# Patient Record
Sex: Female | Born: 1990 | Race: Black or African American | Hispanic: No | Marital: Single | Smoking: Current some day smoker
Health system: Southern US, Community
[De-identification: ages and names within clinical notes are randomized; demographics above are authoritative.]

## PROBLEM LIST (undated history)

## (undated) DIAGNOSIS — O009 Unspecified ectopic pregnancy without intrauterine pregnancy: Secondary | ICD-10-CM

## (undated) DIAGNOSIS — F209 Schizophrenia, unspecified: Secondary | ICD-10-CM

## (undated) DIAGNOSIS — F319 Bipolar disorder, unspecified: Secondary | ICD-10-CM

## (undated) HISTORY — PX: ECTOPIC PREGNANCY SURGERY: SHX613

## (undated) HISTORY — PX: OTHER SURGICAL HISTORY: SHX169

---
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
## 2008-05-17 ENCOUNTER — Emergency Department (HOSPITAL_COMMUNITY): Admission: EM | Admit: 2008-05-17 | Discharge: 2008-05-17 | Payer: Self-pay | Admitting: Emergency Medicine

## 2008-07-06 ENCOUNTER — Ambulatory Visit (HOSPITAL_COMMUNITY): Admission: EM | Admit: 2008-07-06 | Discharge: 2008-07-06 | Payer: Self-pay | Admitting: Emergency Medicine

## 2008-07-06 ENCOUNTER — Encounter: Payer: Self-pay | Admitting: Obstetrics & Gynecology

## 2008-07-14 ENCOUNTER — Emergency Department (HOSPITAL_COMMUNITY): Admission: EM | Admit: 2008-07-14 | Discharge: 2008-07-14 | Payer: Self-pay | Admitting: Emergency Medicine

## 2009-01-05 ENCOUNTER — Emergency Department (HOSPITAL_COMMUNITY): Admission: EM | Admit: 2009-01-05 | Discharge: 2009-01-05 | Payer: Self-pay | Admitting: Emergency Medicine

## 2009-04-22 ENCOUNTER — Emergency Department (HOSPITAL_COMMUNITY): Admission: EM | Admit: 2009-04-22 | Discharge: 2009-04-23 | Payer: Self-pay | Admitting: Emergency Medicine

## 2009-05-18 ENCOUNTER — Emergency Department (HOSPITAL_COMMUNITY): Admission: EM | Admit: 2009-05-18 | Discharge: 2009-05-18 | Payer: Self-pay | Admitting: Emergency Medicine

## 2009-09-12 ENCOUNTER — Emergency Department (HOSPITAL_COMMUNITY): Admission: EM | Admit: 2009-09-12 | Discharge: 2009-09-12 | Payer: Self-pay | Admitting: Emergency Medicine

## 2010-12-08 LAB — CBC
Platelets: 313 10*3/uL (ref 150–400)
RDW: 13.4 % (ref 11.5–15.5)
WBC: 8.7 10*3/uL (ref 4.0–10.5)

## 2010-12-08 LAB — BASIC METABOLIC PANEL
BUN: 11 mg/dL (ref 6–23)
Calcium: 9.3 mg/dL (ref 8.4–10.5)
GFR calc non Af Amer: >60 mL/min (ref >60)
Glucose, Bld: 137 mg/dL — ABNORMAL HIGH (ref 70–99)
Potassium: 3 mEq/L — ABNORMAL LOW (ref 3.5–5.1)
Sodium: 132 mEq/L — ABNORMAL LOW (ref 135–145)

## 2010-12-08 LAB — APTT: aPTT: 29 seconds (ref 24–37)

## 2010-12-08 LAB — TYPE AND SCREEN: ABO/RH(D): O POS

## 2010-12-08 LAB — ETHANOL: Alcohol, Ethyl (B): 10 mg/dL (ref 0–10)

## 2010-12-08 LAB — PROTIME-INR: INR: 0.9 (ref 0.00–1.49)

## 2010-12-11 LAB — URINALYSIS, ROUTINE W REFLEX MICROSCOPIC
Bilirubin Urine: NEGATIVE
Glucose, UA: NEGATIVE mg/dL
Hgb urine dipstick: NEGATIVE
Ketones, ur: NEGATIVE mg/dL
Specific Gravity, Urine: 1.01 (ref 1.005–1.030)
pH: 6 (ref 5.0–8.0)

## 2010-12-11 LAB — URINE MICROSCOPIC-ADD ON

## 2011-01-15 NOTE — Op Note (Signed)
NAMERIONNA, FELTES              ACCOUNT NO.:  [id number]   MEDICAL RECORD NO.:  [id number]          PATIENT TYPE:  AMB   LOCATION:  ED                            FACILITY:  [facility]   PHYSICIAN:  Lazaro Arms, M.D.   DATE OF BIRTH:  02-Apr-1991   DATE OF PROCEDURE:  07/06/2008  DATE OF DISCHARGE:  07/06/2008                               OPERATIVE REPORT   PREOPERATIVE DIAGNOSES:  1. Ruptured right ectopic pregnancy.  2. Hemoperitoneum.   POSTOPERATIVE DIAGNOSES:  1. Ruptured right ectopic pregnancy.  2. Hemoperitoneum.  3. A 300 mL of pneumoperitoneum.   SURGEON:  Lazaro Arms, MD   ANESTHESIA:  General endotracheal.   FINDINGS:  The patient came to the ER with acute lower quadrant pain,  right greater than left.  She had a sonogram, which revealed free fluid  in the cul-de-sac that could positively identify the ectopic.  She had  an hCG of 3500, empty uterus and so by Vidalia Serpas she had to have a  ruptured ectopic.  I did a total laparoscopy.  She had a ruptured  ectopic, it was quite proximal.  I would not call it cornual, possibly  interstitial.  In any event, I will follow her HCGs down to 0, in case  it was interstitial.  The left tube appeared to be normal, and the  distal right tube appeared to be normal.   DESCRIPTION OF OPERATION:  The patient was taken to the operating room  and placed in supine position, where she underwent general endotracheal  anesthesia, placed in lithotomy position, and prepped and draped in  usual sterile fashion.  Foley catheter was placed.  Hulka tenaculum was  placed for uterine manipulation.  Incisions were made in the umbilicus  two fingerbreadths above the pubis in midline in the right lower  quadrant.  Peritoneal cavity was insufflated.  A nonbladed video  laparoscope trocar was used and placed in the peritoneal cavity with one  pass without difficulty.  Peritoneal cavity was insufflated.  Pelvis had  a large amount of blood at the  time of surgery most of which is clotted.  There was no active bleeding.  It was removed with the suction  irrigator.  The ectopic was in the proximal right fallopian tube.  Harmonic scalpel was used and excision was performed.  Fortunately,  there was no bleeding.  I did strip of the serosa of the uterus.  I do  not think it is endometrium, but it in deed could have been  interstitial.  Pelvis was irrigated vigorously.  The ectopic was removed  in EndoCatch bag.  Pedicle was hemostatic.  Instruments were removed.  Gas was allowed to escape.  Umbilical fascia  was closed.  Skin was closed using skin staples.  The patient tolerated  the procedure well.  She was taken to recovery room in good and stable  condition.  All counts were correct.  She received Ancef  prophylactically.      Lazaro Arms, M.D.  Electronically Signed     LHE/MEDQ  D:  07/06/2008  T:  07/07/2008  Job:  [id number]

## 2011-06-04 LAB — COMPREHENSIVE METABOLIC PANEL
Albumin: 3.9
BUN: 10
Calcium: 9.2
Chloride: 104
Creatinine, Ser: 0.75
GFR calc non Af Amer: >60
Total Bilirubin: 0.3

## 2011-06-04 LAB — CROSSMATCH: Antibody Screen: NEGATIVE

## 2011-06-04 LAB — CBC
HCT: 31 — ABNORMAL LOW
MCHC: 33.2
MCHC: 33.4
MCV: 90.8
MCV: 90.9
Platelets: 291
Platelets: 352
WBC: 13.3 — ABNORMAL HIGH

## 2011-06-04 LAB — URINALYSIS, ROUTINE W REFLEX MICROSCOPIC
Bilirubin Urine: NEGATIVE
Bilirubin Urine: NEGATIVE
Leukocytes, UA: NEGATIVE
Nitrite: NEGATIVE
Nitrite: POSITIVE — AB
Specific Gravity, Urine: 1.025
Specific Gravity, Urine: 1.025
Urobilinogen, UA: 0.2
pH: 7.5

## 2011-06-04 LAB — URINE CULTURE

## 2011-06-04 LAB — DIFFERENTIAL
Basophils Absolute: 0
Basophils Absolute: 0
Lymphocytes Relative: 27
Lymphocytes Relative: 7 — ABNORMAL LOW
Lymphs Abs: 1.9
Monocytes Absolute: 0.6
Monocytes Relative: 8
Neutro Abs: 12.2 — ABNORMAL HIGH
Neutro Abs: 4.6

## 2011-06-04 LAB — URINE MICROSCOPIC-ADD ON

## 2011-06-04 LAB — POCT I-STAT, CHEM 8
BUN: 9
Calcium, Ion: 1.25
Chloride: 107

## 2011-06-04 LAB — PREGNANCY, URINE: Preg Test, Ur: POSITIVE

## 2011-06-04 LAB — LIPASE, BLOOD: Lipase: 14

## 2011-06-19 LAB — COMPREHENSIVE METABOLIC PANEL
ALT: 10
Albumin: 3.9
Alkaline Phosphatase: 78
Calcium: 9.1
Potassium: 3.7
Sodium: 135
Total Protein: 7.6

## 2011-06-19 LAB — URINALYSIS, ROUTINE W REFLEX MICROSCOPIC
Bilirubin Urine: NEGATIVE
Leukocytes, UA: NEGATIVE
Nitrite: POSITIVE — AB
Specific Gravity, Urine: >1.03 — ABNORMAL HIGH
pH: 6

## 2011-06-19 LAB — DIFFERENTIAL
Basophils Relative: 0
Eosinophils Absolute: 0
Lymphs Abs: 2.8
Monocytes Absolute: 0.1 — ABNORMAL LOW
Monocytes Relative: 1 — ABNORMAL LOW
Neutro Abs: 7.1 — ABNORMAL HIGH

## 2011-06-19 LAB — URINE MICROSCOPIC-ADD ON

## 2011-06-19 LAB — CBC
Platelets: 324
RDW: 12.9

## 2011-06-19 LAB — URINE CULTURE: Colony Count: >=100000

## 2011-06-19 LAB — PREGNANCY, URINE: Preg Test, Ur: NEGATIVE

## 2011-06-22 ENCOUNTER — Emergency Department (HOSPITAL_COMMUNITY)
Admission: EM | Admit: 2011-06-22 | Discharge: 2011-06-22 | Disposition: A | Discharge status: Home / Self Care | Payer: Self-pay | Attending: Emergency Medicine | Admitting: Emergency Medicine

## 2011-06-22 DIAGNOSIS — B9689 Other specified bacterial agents as the cause of diseases classified elsewhere: Secondary | ICD-10-CM | POA: Insufficient documentation

## 2011-06-22 DIAGNOSIS — F17200 Nicotine dependence, unspecified, uncomplicated: Secondary | ICD-10-CM | POA: Insufficient documentation

## 2011-06-22 DIAGNOSIS — N72 Inflammatory disease of cervix uteri: Secondary | ICD-10-CM | POA: Insufficient documentation

## 2011-06-22 DIAGNOSIS — A499 Bacterial infection, unspecified: Secondary | ICD-10-CM | POA: Insufficient documentation

## 2011-06-22 DIAGNOSIS — N760 Acute vaginitis: Secondary | ICD-10-CM | POA: Insufficient documentation

## 2011-06-22 LAB — WET PREP, GENITAL

## 2011-06-22 MED ORDER — METRONIDAZOLE 500 MG PO TABS: 500.0000 mg | ORAL_TABLET | Freq: Two times a day (BID) | ORAL | Status: AC

## 2011-06-22 MED ORDER — DOXYCYCLINE HYCLATE 100 MG PO CAPS: 100.0000 mg | ORAL_CAPSULE | Freq: Two times a day (BID) | ORAL | Status: AC

## 2011-06-22 MED ORDER — CEFTRIAXONE SODIUM 250 MG IJ SOLR
INTRAMUSCULAR | Status: AC
  Filled 2011-06-22: qty 250

## 2011-06-22 MED ORDER — CEFTRIAXONE SODIUM 250 MG IJ SOLR
125.0000 mg | Freq: Once | INTRAMUSCULAR | Status: AC
  Administered 2011-06-22: 125 mg via INTRAMUSCULAR

## 2011-06-22 NOTE — ED Notes (Signed)
Was told by sexual partner today that he is being treated for std, and she now wants to be checked.  Denies any vaginal discharge, no fever, denies any urinary s/s

## 2011-06-22 NOTE — ED Notes (Signed)
Pt c/o contracting STD from boyfriend, States boyfriend was at ED last night and told her today he was diagnosed with chlamydia and gonorrhea.  Pt was nervous/ anxious and needs a diagnosis and to be treated.  Pt states has no $ until Thursday. Pt denies vaginal discharge or symptoms.

## 2011-06-22 NOTE — ED Notes (Signed)
Pt stated she has been on depo-provera, is due for shot, has not gotten d/t medicaid not being up to date.

## 2011-06-22 NOTE — ED Notes (Signed)
Pt left the er stating no needs 

## 2011-06-23 NOTE — ED Provider Notes (Signed)
History     CSN: [id number] Arrival date & time: 06/22/2011  8:03 PM   First MD Initiated Contact with Patient 06/22/11 2018      Chief Complaint  Patient presents with  . Exposure to STD    (Consider location/radiation/quality/duration/timing/severity/associated sxs/prior treatment) HPI... patient's boyfriend called and said he had chlamydia. Patient has slight vaginal discharge. No fever chills or dysuria. No abdominal or flank pain. Symptoms started a few days ago. He makes better or worse  History reviewed. No pertinent past medical history.  Past Surgical History  Procedure Date  . Ovarian pregnancy     No family history on file.  History  Substance Use Topics  . Smoking status: Current Everyday Smoker    Types: Cigarettes  . Smokeless tobacco: Not on file  . Alcohol Use: Yes     occ    OB History    Grav Para Term Preterm Abortions TAB SAB Ect Mult Living                  Review of Systems  All other systems reviewed and are negative.    Allergies  Review of patient's allergies indicates no known allergies.  Home Medications   Current Outpatient Rx  Name Route Sig Dispense Refill  . DOXYCYCLINE HYCLATE 100 MG PO CAPS Oral Take 1 capsule (100 mg total) by mouth 2 (two) times daily. 14 capsule 0  . METRONIDAZOLE 500 MG PO TABS Oral Take 1 tablet (500 mg total) by mouth 2 (two) times daily. 14 tablet 0    BP 144/82  Pulse 98  Temp(Src) 97.5 F (36.4 C) (Oral)  Resp 20  Ht 5\' 7"  (1.702 m)  Wt 135 lb (61.236 kg)  BMI 21.14 kg/m2  SpO2 100%  LMP 06/02/2011  Physical Exam  Nursing note and vitals reviewed. Constitutional: She is oriented to person, place, and time. She appears well-developed and well-nourished.  HENT:  Head: Normocephalic and atraumatic.  Eyes: Conjunctivae and EOM are normal. Pupils are equal, round, and reactive to light.  Neck: Normal range of motion. Neck supple.  Cardiovascular: Normal rate and regular rhythm.     Pulmonary/Chest: Effort normal and breath sounds normal.  Abdominal: Soft. Bowel sounds are normal.  Genitourinary:       External genitalia normal. Copious white thick peri cervical discharge.  Cervical motion tenderness. Adnexa normal. uterus normal  Musculoskeletal: Normal range of motion.  Neurological: She is alert and oriented to person, place, and time.  Skin: Skin is warm and dry.  Psychiatric: She has a normal mood and affect.    ED Course  Procedures (including critical care time)  Labs Reviewed  WET PREP, GENITAL - Abnormal; Notable for the following:    Clue Cells, Wet Prep MANY (*)    WBC, Wet Prep HPF POC FEW (*)    All other components within normal limits  GONOCOCCUS CULTURE  CHLAMYDIA TRACHOMATIS, DNA, AMP PROBE   No results found.   1. Cervicitis   2. Bacterial vaginosis       MDM  Patient exposed to at least chlamydia. Will treat for Chlamydia, gonorrhea, and bacterial vaginosis. Followup at health department        Donnetta Hutching, MD 06/23/11 [phone]

## 2011-06-24 LAB — CHLAMYDIA TRACHOMATIS, DNA, AMP PROBE: Chlamydia, DNA Probe: NEGATIVE

## 2011-07-19 ENCOUNTER — Emergency Department (HOSPITAL_COMMUNITY)
Admission: EM | Admit: 2011-07-19 | Discharge: 2011-07-19 | Disposition: A | Discharge status: Home / Self Care | Payer: Self-pay | Attending: Emergency Medicine | Admitting: Emergency Medicine

## 2011-07-19 ENCOUNTER — Encounter (HOSPITAL_COMMUNITY): Payer: Self-pay

## 2011-07-19 DIAGNOSIS — R112 Nausea with vomiting, unspecified: Secondary | ICD-10-CM | POA: Insufficient documentation

## 2011-07-19 DIAGNOSIS — R6883 Chills (without fever): Secondary | ICD-10-CM | POA: Insufficient documentation

## 2011-07-19 DIAGNOSIS — R05 Cough: Secondary | ICD-10-CM | POA: Insufficient documentation

## 2011-07-19 DIAGNOSIS — N390 Urinary tract infection, site not specified: Secondary | ICD-10-CM | POA: Insufficient documentation

## 2011-07-19 DIAGNOSIS — R1110 Vomiting, unspecified: Secondary | ICD-10-CM

## 2011-07-19 DIAGNOSIS — M549 Dorsalgia, unspecified: Secondary | ICD-10-CM | POA: Insufficient documentation

## 2011-07-19 DIAGNOSIS — R059 Cough, unspecified: Secondary | ICD-10-CM | POA: Insufficient documentation

## 2011-07-19 DIAGNOSIS — F17200 Nicotine dependence, unspecified, uncomplicated: Secondary | ICD-10-CM | POA: Insufficient documentation

## 2011-07-19 DIAGNOSIS — IMO0001 Reserved for inherently not codable concepts without codable children: Secondary | ICD-10-CM | POA: Insufficient documentation

## 2011-07-19 LAB — URINALYSIS, ROUTINE W REFLEX MICROSCOPIC
Nitrite: NEGATIVE
Specific Gravity, Urine: 1.02 (ref 1.005–1.030)
Urobilinogen, UA: >8 mg/dL — ABNORMAL HIGH (ref 0.0–1.0)
pH: 6 (ref 5.0–8.0)

## 2011-07-19 LAB — DIFFERENTIAL
Eosinophils Absolute: 0 10*3/uL (ref 0.0–0.7)
Eosinophils Relative: 0 % (ref 0–5)
Lymphocytes Relative: 9 % — ABNORMAL LOW (ref 12–46)
Lymphs Abs: 0.9 10*3/uL (ref 0.7–4.0)
Monocytes Absolute: 1.3 10*3/uL — ABNORMAL HIGH (ref 0.1–1.0)
Monocytes Relative: 13 % — ABNORMAL HIGH (ref 3–12)

## 2011-07-19 LAB — COMPREHENSIVE METABOLIC PANEL
BUN: 6 mg/dL (ref 6–23)
CO2: 25 mEq/L (ref 19–32)
Calcium: 9.6 mg/dL (ref 8.4–10.5)
Creatinine, Ser: 0.75 mg/dL (ref 0.50–1.10)
GFR calc Af Amer: >90 mL/min (ref >90)
GFR calc non Af Amer: >90 mL/min (ref >90)
Glucose, Bld: 108 mg/dL — ABNORMAL HIGH (ref 70–99)
Sodium: 135 mEq/L (ref 135–145)
Total Protein: 8.3 g/dL (ref 6.0–8.3)

## 2011-07-19 LAB — CBC
HCT: 36.8 % (ref 36.0–46.0)
MCH: 30.6 pg (ref 26.0–34.0)
MCV: 92.9 fL (ref 78.0–100.0)
Platelets: 198 10*3/uL (ref 150–400)
RBC: 3.96 MIL/uL (ref 3.87–5.11)
WBC: 10.2 10*3/uL (ref 4.0–10.5)

## 2011-07-19 LAB — PREGNANCY, URINE: Preg Test, Ur: NEGATIVE

## 2011-07-19 LAB — URINE MICROSCOPIC-ADD ON

## 2011-07-19 MED ORDER — SODIUM CHLORIDE 0.9 % IV SOLN
Freq: Once | INTRAVENOUS | Status: AC
  Administered 2011-07-19: 11:00:00 via INTRAVENOUS

## 2011-07-19 MED ORDER — KETOROLAC TROMETHAMINE 30 MG/ML IJ SOLN
30.0000 mg | Freq: Once | INTRAMUSCULAR | Status: AC
  Administered 2011-07-19: 30 mg via INTRAVENOUS
  Filled 2011-07-19: qty 1

## 2011-07-19 MED ORDER — ONDANSETRON HCL 4 MG/2ML IJ SOLN
4.0000 mg | Freq: Once | INTRAMUSCULAR | Status: AC
Start: 2011-07-19 — End: 2011-07-19
  Administered 2011-07-19: 4 mg via INTRAVENOUS
  Filled 2011-07-19: qty 2

## 2011-07-19 MED ORDER — SULFAMETHOXAZOLE-TRIMETHOPRIM 800-160 MG PO TABS: 1.0000 | ORAL_TABLET | Freq: Two times a day (BID) | ORAL | Status: AC

## 2011-07-19 NOTE — ED Notes (Signed)
IV discontinued and pressure dressing applied.

## 2011-07-19 NOTE — ED Notes (Signed)
Complain of cold symptoms and vomiting

## 2011-07-19 NOTE — ED Provider Notes (Signed)
History    Scribed for Geoffery Lyons, MD, the patient was seen in room [facility]. This chart was scribed by Katha Cabal.   CSN: [id number] Arrival date & time: 07/19/2011 10:39 AM   First MD Initiated Contact with Patient 07/19/11 1046      Chief Complaint  Patient presents with  . Emesis    (Consider location/radiation/quality/duration/timing/severity/associated sxs/prior treatment) Patient is a 20 y.o. female presenting with vomiting. The history is provided by the patient. No language interpreter was used.  Emesis  This is a new problem. The current episode started 2 days ago. The problem occurs 2 to 4 times per day. The problem has not changed since onset.The emesis has an appearance of stomach contents. There has been no fever. Associated symptoms include chills, cough and myalgias. Pertinent negatives include no diarrhea.   Patient ate chicken noddle soup and ginger ale.  Patient reports last menstrual period was normal.   History reviewed. No pertinent past medical history.  Past Surgical History  Procedure Date  . Ovarian pregnancy     No family history on file.  History  Substance Use Topics  . Smoking status: Current Everyday Smoker    Types: Cigarettes  . Smokeless tobacco: Not on file  . Alcohol Use: Yes     occ    OB History    Grav Para Term Preterm Abortions TAB SAB Ect Mult Living                  Review of Systems  Constitutional: Positive for chills.  HENT: Negative for ear pain.   Respiratory: Positive for cough.   Gastrointestinal: Positive for nausea and vomiting. Negative for diarrhea.  Genitourinary: Negative for dysuria.  Musculoskeletal: Positive for myalgias and back pain.  All other systems reviewed and are negative.    Allergies  Review of patient's allergies indicates no known allergies.  Home Medications   Current Outpatient Rx  Name Route Sig Dispense Refill  . IBUPROFEN 200 MG PO TABS Oral Take 200-400 mg by mouth  every 6 (six) hours as needed. Pain     . SULFAMETHOXAZOLE-TRIMETHOPRIM 800-160 MG PO TABS Oral Take 1 tablet by mouth 2 (two) times daily. 28 tablet 0    BP 114/59  Pulse 89  Temp(Src) 98.2 F (36.8 C) (Oral)  Resp 20  Ht 5\' 6"  (1.676 m)  Wt 163 lb (73.936 kg)  BMI 26.31 kg/m2  SpO2 100%  LMP 06/02/2011  Physical Exam  Nursing note and vitals reviewed. Constitutional: She is oriented to person, place, and time. She appears well-developed.  HENT:  Head: Normocephalic and atraumatic.  Right Ear: Tympanic membrane normal.  Left Ear: Tympanic membrane normal.  Mouth/Throat: Oropharynx is clear and moist. No oropharyngeal exudate.  Eyes: Conjunctivae and EOM are normal.  Neck: Normal range of motion. Neck supple.  Cardiovascular: Normal rate and regular rhythm.   No murmur heard. Pulmonary/Chest: Effort normal and breath sounds normal. No respiratory distress.  Abdominal: Soft. There is no tenderness. There is no rebound and no guarding.  Musculoskeletal: Normal range of motion. She exhibits no edema.  Lymphadenopathy:    She has no cervical adenopathy.  Neurological: She is alert and oriented to person, place, and time. No sensory deficit.  Skin: Skin is warm and dry. No rash noted.  Psychiatric: She has a normal mood and affect. Her behavior is normal.    ED Course  Procedures (including critical care time)   DIAGNOSTIC STUDIES: Oxygen Saturation is 100% on  room air, normal by my interpretation.    COORDINATION OF CARE:  10:56 AM  Physical exam complete.  Will order labs and antiemetic.  12:33 PM  Discussed exam results with patient.  Plan to discharge patient.  Patient agrees with discharge patient home with antibiotic.    Orders Placed This Encounter  Procedures  . CBC  . Differential  . Comprehensive metabolic panel  . Urinalysis with microscopic  . Pregnancy, urine  . Urine microscopic-add on     LABS / RADIOLOGY:    Labs Reviewed  DIFFERENTIAL -  Abnormal; Notable for the following:    Neutrophils Relative 78 (*)    Neutro Abs 8.0 (*)    Lymphocytes Relative 9 (*)    Monocytes Relative 13 (*)    Monocytes Absolute 1.3 (*)    All other components within normal limits  COMPREHENSIVE METABOLIC PANEL - Abnormal; Notable for the following:    Potassium 3.3 (*)    Glucose, Bld 108 (*)    All other components within normal limits  URINALYSIS, ROUTINE W REFLEX MICROSCOPIC - Abnormal; Notable for the following:    Glucose, UA 100 (*)    Hgb urine dipstick TRACE (*)    Bilirubin Urine SMALL (*)    Ketones, ur >80 (*)    Protein, ur 30 (*)    Urobilinogen, UA >8.0 (*)    Leukocytes, UA SMALL (*)    All other components within normal limits  URINE MICROSCOPIC-ADD ON - Abnormal; Notable for the following:    Squamous Epithelial / LPF MANY (*)    Bacteria, UA MANY (*)    All other components within normal limits  CBC  PREGNANCY, URINE   No results found.       MDM   MDM: Patient with vomiting, enteritis-like picture and urine suggestive of uti.  Will treat with bactrim, give time.  To follow up prn.     MEDICATIONS GIVEN IN THE E.D. Scheduled Meds:    . sodium chloride   Intravenous Once  . ketorolac  30 mg Intravenous Once  . ondansetron (ZOFRAN) IV  4 mg Intravenous Once   Continuous Infusions:      IMPRESSION: 1. Vomiting   2. Urinary tract infection      DISCHARGE MEDICATIONS: New Prescriptions   SULFAMETHOXAZOLE-TRIMETHOPRIM (SEPTRA DS) 800-160 MG PER TABLET    Take 1 tablet by mouth 2 (two) times daily.      I personally performed the services described in this documentation, which was scribed in my presence. The recorded information has been reviewed and considered.             Geoffery Lyons, MD 07/19/11 [phone]

## 2011-08-16 ENCOUNTER — Encounter (HOSPITAL_COMMUNITY): Payer: Self-pay

## 2011-08-16 ENCOUNTER — Emergency Department (HOSPITAL_COMMUNITY)
Admission: EM | Admit: 2011-08-16 | Discharge: 2011-08-16 | Disposition: A | Discharge status: Home / Self Care | Payer: Self-pay | Attending: Emergency Medicine | Admitting: Emergency Medicine

## 2011-08-16 ENCOUNTER — Other Ambulatory Visit: Payer: Self-pay

## 2011-08-16 DIAGNOSIS — R55 Syncope and collapse: Secondary | ICD-10-CM | POA: Insufficient documentation

## 2011-08-16 DIAGNOSIS — F17200 Nicotine dependence, unspecified, uncomplicated: Secondary | ICD-10-CM | POA: Insufficient documentation

## 2011-08-16 DIAGNOSIS — K029 Dental caries, unspecified: Secondary | ICD-10-CM | POA: Insufficient documentation

## 2011-08-16 DIAGNOSIS — Z136 Encounter for screening for cardiovascular disorders: Secondary | ICD-10-CM | POA: Insufficient documentation

## 2011-08-16 LAB — URINALYSIS, ROUTINE W REFLEX MICROSCOPIC
Bilirubin Urine: NEGATIVE
Ketones, ur: NEGATIVE mg/dL
Nitrite: NEGATIVE
Specific Gravity, Urine: 1.015 (ref 1.005–1.030)
Urobilinogen, UA: 0.2 mg/dL (ref 0.0–1.0)

## 2011-08-16 LAB — BASIC METABOLIC PANEL
BUN: 7 mg/dL (ref 6–23)
Creatinine, Ser: 0.78 mg/dL (ref 0.50–1.10)
GFR calc Af Amer: >90 mL/min (ref >90)
GFR calc non Af Amer: >90 mL/min (ref >90)
Potassium: 3.7 mEq/L (ref 3.5–5.1)

## 2011-08-16 LAB — DIFFERENTIAL
Basophils Absolute: 0 10*3/uL (ref 0.0–0.1)
Basophils Relative: 1 % (ref 0–1)
Eosinophils Absolute: 0.1 10*3/uL (ref 0.0–0.7)
Monocytes Relative: 7 % (ref 3–12)
Neutrophils Relative %: 64 % (ref 43–77)

## 2011-08-16 LAB — PREGNANCY, URINE: Preg Test, Ur: NEGATIVE

## 2011-08-16 LAB — CBC
Hemoglobin: 11.6 g/dL — ABNORMAL LOW (ref 12.0–15.0)
MCH: 30.2 pg (ref 26.0–34.0)
MCHC: 32.3 g/dL (ref 30.0–36.0)
RDW: 14.4 % (ref 11.5–15.5)

## 2011-08-16 MED ORDER — SODIUM CHLORIDE 0.9 % IV SOLN
Freq: Once | INTRAVENOUS | Status: AC
  Administered 2011-08-16: 20:00:00 via INTRAVENOUS

## 2011-08-16 NOTE — ED Provider Notes (Signed)
History     CSN: [id number] Arrival date & time: 08/16/2011  6:59 PM   First MD Initiated Contact with Patient 08/16/11 1928      Chief Complaint  Patient presents with  . Loss of Consciousness    (Consider location/radiation/quality/duration/timing/severity/associated sxs/prior treatment) Patient is a 20 y.o. female presenting with syncope.  Loss of Consciousness This is a new problem. The current episode started less than 1 hour ago. The problem has been gradually improving. The symptoms are aggravated by nothing. The symptoms are relieved by nothing. She has tried nothing for the symptoms.    History reviewed. No pertinent past medical history.  Past Surgical History  Procedure Date  . Ovarian pregnancy   . Tubal ligation     History reviewed. No pertinent family history.  History  Substance Use Topics  . Smoking status: Current Some Day Smoker    Types: Cigarettes  . Smokeless tobacco: Not on file  . Alcohol Use: No    OB History    Grav Para Term Preterm Abortions TAB SAB Ect Mult Living                  Review of Systems  Constitutional: Negative.   HENT: Positive for dental problem.   Eyes: Negative.   Respiratory: Negative.   Cardiovascular: Positive for syncope.  Gastrointestinal: Negative.   Genitourinary: Negative.   Musculoskeletal: Negative.   Skin: Negative.   Neurological: Negative.   Psychiatric/Behavioral: Negative.     Allergies  Review of patient's allergies indicates no known allergies.  Home Medications   Current Outpatient Rx  Name Route Sig Dispense Refill  . IBUPROFEN 200 MG PO TABS Oral Take 200-400 mg by mouth every 6 (six) hours as needed. Pain       BP 113/62  Pulse 75  Temp(Src) 97.7 F (36.5 C) (Oral)  Resp 20  Ht 5\' 5"  (1.651 m)  Wt 153 lb (69.4 kg)  BMI 25.46 kg/m2  SpO2 100%  LMP 08/01/2011  Physical Exam  Constitutional: She is oriented to person, place, and time. She appears well-developed and  well-nourished.       Patient is awake and alert, quite chatty.  HENT:  Head: Normocephalic and atraumatic.  Right Ear: External ear normal.  Left Ear: External ear normal.       She has a decayed left lower first molar tooth.    Eyes: Conjunctivae and EOM are normal. Pupils are equal, round, and reactive to light.  Neck: Normal range of motion. Neck supple.  Cardiovascular: Normal rate, regular rhythm and normal heart sounds.   Pulmonary/Chest: Effort normal and breath sounds normal.  Abdominal: Soft. Bowel sounds are normal.  Musculoskeletal: Normal range of motion.  Neurological: She is alert and oriented to person, place, and time.       No sensory or motor deficit.  Skin: Skin is warm and dry.  Psychiatric: She has a normal mood and affect. Her behavior is normal.    ED Course  Procedures (including critical care time)   Labs Reviewed  CBC  DIFFERENTIAL  BASIC METABOLIC PANEL  URINALYSIS, ROUTINE W REFLEX MICROSCOPIC  URINE CULTURE  PREGNANCY, URINE   8:21 PM  Date: 08/16/2011  Rate: 68  Rhythm: normal sinus rhythm  QRS Axis: left  Intervals: normal QRS:  Minimal voltage criteria for left ventricular hypertrophy.  ST/T Wave abnormalities: normal  Conduction Disutrbances:none  Narrative Interpretation: Borderline EKG  Old EKG Reviewed: changes noted--axis has changed from right to left  axis since 01/05/2009, of questionable significance.  Marland Kitchen9:45 PM Results for orders placed during the hospital encounter of 08/16/11  CBC      Component Value Range   WBC 7.3  4.0 - 10.5 (K/uL)   RBC 3.84 (*) 3.87 - 5.11 (MIL/uL)   Hemoglobin 11.6 (*) 12.0 - 15.0 (g/dL)   HCT 16.1 (*) 09.6 - 46.0 (%)   MCV 93.5  78.0 - 100.0 (fL)   MCH 30.2  26.0 - 34.0 (pg)   MCHC 32.3  30.0 - 36.0 (g/dL)   RDW 04.5  40.9 - 81.1 (%)   Platelets 274  150 - 400 (K/uL)  DIFFERENTIAL      Component Value Range   Neutrophils Relative 64  43 - 77 (%)   Neutro Abs 4.7  1.7 - 7.7 (K/uL)    Lymphocytes Relative 28  12 - 46 (%)   Lymphs Abs 2.0  0.7 - 4.0 (K/uL)   Monocytes Relative 7  3 - 12 (%)   Monocytes Absolute 0.5  0.1 - 1.0 (K/uL)   Eosinophils Relative 1  0 - 5 (%)   Eosinophils Absolute 0.1  0.0 - 0.7 (K/uL)   Basophils Relative 1  0 - 1 (%)   Basophils Absolute 0.0  0.0 - 0.1 (K/uL)  BASIC METABOLIC PANEL      Component Value Range   Sodium 137  135 - 145 (mEq/L)   Potassium 3.7  3.5 - 5.1 (mEq/L)   Chloride 102  96 - 112 (mEq/L)   CO2 29  19 - 32 (mEq/L)   Glucose, Bld 81  70 - 99 (mg/dL)   BUN 7  6 - 23 (mg/dL)   Creatinine, Ser 9.14  0.50 - 1.10 (mg/dL)   Calcium 9.9  8.4 - 78.2 (mg/dL)   GFR calc non Af Amer >90  >90 (mL/min)   GFR calc Af Amer >90  >90 (mL/min)  URINALYSIS, ROUTINE W REFLEX MICROSCOPIC      Component Value Range   Color, Urine YELLOW  YELLOW    APPearance CLEAR  CLEAR    Specific Gravity, Urine 1.015  1.005 - 1.030    pH 6.0  5.0 - 8.0    Glucose, UA NEGATIVE  NEGATIVE (mg/dL)   Hgb urine dipstick NEGATIVE  NEGATIVE    Bilirubin Urine NEGATIVE  NEGATIVE    Ketones, ur NEGATIVE  NEGATIVE (mg/dL)   Protein, ur NEGATIVE  NEGATIVE (mg/dL)   Urobilinogen, UA 0.2  0.0 - 1.0 (mg/dL)   Nitrite NEGATIVE  NEGATIVE    Leukocytes, UA NEGATIVE  NEGATIVE   PREGNANCY, URINE      Component Value Range   Preg Test, Ur NEGATIVE     9:45 PM Pt's lab tests and EKG are normal.  Reassured and released.     1. Vasovagal syncope         Carleene Cooper III, MD 08/16/11 2147

## 2011-08-16 NOTE — ED Notes (Signed)
Pt had syncopal episode at work. Pt had EMS called and was told her BP was low when she stood up.

## 2011-08-18 LAB — URINE CULTURE

## 2011-08-20 ENCOUNTER — Emergency Department (HOSPITAL_COMMUNITY)
Admission: EM | Admit: 2011-08-20 | Discharge: 2011-08-20 | Discharge status: Against Medical Advice | Payer: Self-pay | Attending: Emergency Medicine | Admitting: Emergency Medicine

## 2011-08-20 ENCOUNTER — Encounter (HOSPITAL_COMMUNITY): Payer: Self-pay | Admitting: *Deleted

## 2011-08-20 DIAGNOSIS — R42 Dizziness and giddiness: Secondary | ICD-10-CM | POA: Insufficient documentation

## 2011-08-20 NOTE — ED Notes (Signed)
Pt c/o dizziness and weakness since Saturday. States that she passed out at work on Friday and was seen in the ED. Pt states that the dizziness is no better. Pt has fever today.

## 2015-01-06 ENCOUNTER — Emergency Department (HOSPITAL_COMMUNITY)
Admission: EM | Admit: 2015-01-06 | Discharge: 2015-01-06 | Disposition: A | Discharge status: Home / Self Care | Payer: Self-pay | Attending: Emergency Medicine | Admitting: Emergency Medicine

## 2015-01-06 ENCOUNTER — Encounter (HOSPITAL_COMMUNITY): Payer: Self-pay | Admitting: Emergency Medicine

## 2015-01-06 DIAGNOSIS — Z720 Tobacco use: Secondary | ICD-10-CM | POA: Insufficient documentation

## 2015-01-06 DIAGNOSIS — M65052 Abscess of tendon sheath, left thigh: Secondary | ICD-10-CM | POA: Insufficient documentation

## 2015-01-06 DIAGNOSIS — L02416 Cutaneous abscess of left lower limb: Secondary | ICD-10-CM

## 2015-01-06 MED ORDER — OXYCODONE-ACETAMINOPHEN 5-325 MG PO TABS
1.0000 | ORAL_TABLET | Freq: Once | ORAL | Status: AC
  Administered 2015-01-06: 1 via ORAL
  Filled 2015-01-06: qty 1

## 2015-01-06 MED ORDER — LIDOCAINE HCL 2 % EX GEL
1.0000 "application " | Freq: Once | CUTANEOUS | Status: AC
  Administered 2015-01-06: 1 via TOPICAL
  Filled 2015-01-06: qty 10

## 2015-01-06 MED ORDER — SULFAMETHOXAZOLE-TRIMETHOPRIM 800-160 MG PO TABS
1.0000 | ORAL_TABLET | Freq: Two times a day (BID) | ORAL | Status: AC
Start: 2015-01-06 — End: 2015-01-13

## 2015-01-06 MED ORDER — LIDOCAINE HCL (PF) 1 % IJ SOLN
5.0000 mL | Freq: Once | INTRAMUSCULAR | Status: AC
  Administered 2015-01-06: 5 mL
  Filled 2015-01-06: qty 5

## 2015-01-06 MED ORDER — HYDROCODONE-ACETAMINOPHEN 5-325 MG PO TABS: 1.0000 | ORAL_TABLET | ORAL | Status: DC | PRN

## 2015-01-06 NOTE — ED Provider Notes (Signed)
CSN: [id number]     Arrival date & time 01/06/15  [id number] History   First MD Initiated Contact with Patient 01/06/15 [phone]     Chief Complaint  Patient presents with  . Abscess     (Consider location/radiation/quality/duration/timing/severity/associated sxs/prior Treatment) Patient is a 24 y.o. female presenting with abscess. The history is provided by the patient.  Abscess Location:  Leg Leg abscess location:  L upper leg Abscess quality: fluctuance, painful, redness and warmth   Red streaking: no   Duration:  4 days Progression:  Worsening Pain details:    Quality:  Throbbing and sharp   Severity:  Severe Chronicity:  New Relieved by:  Nothing Worsened by:  Nothing tried Ineffective treatments:  Warm compresses  Denise Clark is a 24 y.o. female who presents to the ED with an abscess to the inner aspect of the left upper thigh that started 4 days ago and has gotten worse. She has been applying warm wet compresses to the area without relief. She denies any other problems today.   History reviewed. No pertinent past medical history. Past Surgical History  Procedure Laterality Date  . Ovarian pregnancy    . Tubal ligation    . Cesarean section     History reviewed. No pertinent family history. History  Substance Use Topics  . Smoking status: Current Some Day Smoker -- 0.05 packs/day    Types: Cigars, Cigarettes  . Smokeless tobacco: Not on file  . Alcohol Use: No   OB History    Gravida Para Term Preterm AB TAB SAB Ectopic Multiple Living            1     Review of Systems Negative except as stated in HPI   Allergies  Review of patient's allergies indicates no known allergies.  Home Medications   Prior to Admission medications   Medication Sig Start Date End Date Taking? Authorizing Provider  HYDROcodone-acetaminophen (NORCO/VICODIN) 5-325 MG per tablet Take 1 tablet by mouth every 4 (four) hours as needed. 01/06/15   Travas Schexnayder Orlene OchM Talayeh Bruinsma, NP  ibuprofen (ADVIL,MOTRIN)  200 MG tablet Take 200-400 mg by mouth every 6 (six) hours as needed. Pain     Historical Provider, MD  sulfamethoxazole-trimethoprim (BACTRIM DS,SEPTRA DS) 800-160 MG per tablet Take 1 tablet by mouth 2 (two) times daily. 01/06/15 01/13/15  Rudolph Daoust Orlene OchM Marvyn Torrez, NP  tetrahydrozoline (EYE DROPS) 0.05 % ophthalmic solution Place 1 drop into both eyes daily as needed. For allergy eyes     Historical Provider, MD   BP 101/75 mmHg  Pulse 82  Temp(Src) 98.7 F (37.1 C) (Oral)  Resp 16  Ht 5\' 7"  (1.702 m)  Wt 164 lb (74.39 kg)  BMI 25.68 kg/m2  SpO2 100% Physical Exam  Constitutional: She is oriented to person, place, and time. She appears well-developed and well-nourished. No distress.  HENT:  Head: Normocephalic.  Eyes: Conjunctivae and EOM are normal.  Neck: Normal range of motion. Neck supple.  Cardiovascular: Normal rate.   Pulmonary/Chest: Effort normal.  Musculoskeletal: Normal range of motion.       Left upper leg: She exhibits tenderness and swelling.       Legs: Inner aspect of left upper thigh with swelling, erythema and tenderness.   Neurological: She is alert and oriented to person, place, and time. No cranial nerve deficit.  Skin: Skin is warm and dry.  Psychiatric: She has a normal mood and affect. Her behavior is normal.  Nursing note and vitals reviewed.  ED Course  INCISION AND DRAINAGE Date/Time: 01/06/2015 10:26 AM Performed by: Janne NapoleonNEESE, Dayane Hillenburg M Authorized by: Janne NapoleonNEESE, Claiborne Stroble M Consent: Verbal consent obtained. Risks and benefits: risks, benefits and alternatives were discussed Consent given by: patient Patient understanding: patient states understanding of the procedure being performed Required items: required blood products, implants, devices, and special equipment available Patient identity confirmed: verbally with patient Type: abscess Body area: lower extremity (inner aspect of left thigh) Anesthesia: local infiltration Local anesthetic: lidocaine 1% without  epinephrine Anesthetic total: 3 ml Patient sedated: no Scalpel size: 11 Incision type: single straight Complexity: simple Drainage: purulent Drainage amount: moderate Wound treatment: wound left open Patient tolerance: Patient tolerated the procedure well with no immediate complications   (including critical care time)   MDM  24 y.o. female with abscess to the left thigh. Will treat with antibiotics and pain medication. She will continue warm soaks. She will return as needed for problems. Discussed with the patient and all questioned fully answered. She voices understanding and agrees with plan.   Final diagnoses:  Abscess of left thigh        Janne NapoleonHope M Jolana Runkles, NP 01/06/15 1030  Gilda Creasehristopher J Pollina, MD 01/07/15 [phone]

## 2015-01-06 NOTE — Discharge Instructions (Signed)
Take ibuprofen in addition to the other medications. Sit in warm tubs of water or apply warm wet compresses to the areas several times a day to help all the infection drain. Return for any problems. We did not put a packing in the abscess so you will not need to return for packing removal.

## 2015-01-06 NOTE — ED Notes (Signed)
Pt from home. Per pt she has had a sore spot on her left inner thigh/groin area that has been bothering her for about four days. She has been applying warm washcloths to it at home. The are is reddened and appears to have light drainage.

## 2015-04-05 ENCOUNTER — Encounter (HOSPITAL_COMMUNITY): Payer: Self-pay | Admitting: Emergency Medicine

## 2015-04-05 ENCOUNTER — Emergency Department (HOSPITAL_COMMUNITY)
Admission: EM | Admit: 2015-04-05 | Discharge: 2015-04-05 | Disposition: A | Discharge status: Home / Self Care | Payer: Self-pay | Attending: Emergency Medicine | Admitting: Emergency Medicine

## 2015-04-05 DIAGNOSIS — Z720 Tobacco use: Secondary | ICD-10-CM | POA: Insufficient documentation

## 2015-04-05 DIAGNOSIS — L02416 Cutaneous abscess of left lower limb: Secondary | ICD-10-CM | POA: Insufficient documentation

## 2015-04-05 MED ORDER — HYDROCODONE-ACETAMINOPHEN 5-325 MG PO TABS: 1.0000 | ORAL_TABLET | Freq: Four times a day (QID) | ORAL | Status: DC | PRN

## 2015-04-05 MED ORDER — LIDOCAINE-EPINEPHRINE-TETRACAINE (LET) SOLUTION
3.0000 mL | Freq: Once | NASAL | Status: AC
  Administered 2015-04-05: 3 mL via TOPICAL
  Filled 2015-04-05: qty 3

## 2015-04-05 MED ORDER — DOXYCYCLINE HYCLATE 100 MG PO CAPS: 100.0000 mg | ORAL_CAPSULE | Freq: Two times a day (BID) | ORAL | Status: DC

## 2015-04-05 MED ORDER — POVIDONE-IODINE 10 % EX SOLN
CUTANEOUS | Status: AC
  Filled 2015-04-05: qty 118

## 2015-04-05 MED ORDER — LIDOCAINE HCL (PF) 1 % IJ SOLN
INTRAMUSCULAR | Status: AC
  Filled 2015-04-05: qty 10

## 2015-04-05 NOTE — ED Notes (Signed)
Boil to left inner thigh.  Rates pain 10/10.

## 2015-04-05 NOTE — ED Provider Notes (Addendum)
CSN: [id number]     Arrival date & time 04/05/15  0709 History   First MD Initiated Contact with Patient 04/05/15 [phone]     Chief Complaint  Patient presents with  . Cyst    left inner thigh     (Consider location/radiation/quality/duration/timing/severity/associated sxs/prior Treatment) The history is provided by the patient.   patient the with complaint of recurrent boil to left proximal inner thigh states has been present for 2 days. Red swollen tender. No discharge. States pain is 10 out of 10. No other complaints. Pain is sharp in nature.  History reviewed. No pertinent past medical history. Past Surgical History  Procedure Laterality Date  . Ovarian pregnancy    . Tubal ligation    . Cesarean section     History reviewed. No pertinent family history. History  Substance Use Topics  . Smoking status: Current Some Day Smoker -- 0.05 packs/day    Types: Cigars, Cigarettes  . Smokeless tobacco: Not on file  . Alcohol Use: No   OB History    Gravida Para Term Preterm AB TAB SAB Ectopic Multiple Living            1     Review of Systems  Constitutional: Negative for fever.  HENT: Negative for congestion.   Eyes: Negative for redness.  Respiratory: Negative for shortness of breath.   Cardiovascular: Negative for chest pain.  Gastrointestinal: Negative for nausea, vomiting and abdominal pain.  Genitourinary: Negative for dysuria.  Musculoskeletal: Negative for back pain.  Skin: Positive for wound. Negative for rash.  Neurological: Negative for headaches.  Hematological: Does not bruise/bleed easily.  Psychiatric/Behavioral: Negative for confusion.      Allergies  Review of patient's allergies indicates no known allergies.  Home Medications   Prior to Admission medications   Medication Sig Start Date End Date Taking? Authorizing Provider  HYDROcodone-acetaminophen (NORCO/VICODIN) 5-325 MG per tablet Take 1 tablet by mouth every 4 (four) hours as needed. 01/06/15    Hope Orlene Och, NP  ibuprofen (ADVIL,MOTRIN) 200 MG tablet Take 200-400 mg by mouth every 6 (six) hours as needed. Pain     Historical Provider, MD  tetrahydrozoline (EYE DROPS) 0.05 % ophthalmic solution Place 1 drop into both eyes daily as needed. For allergy eyes     Historical Provider, MD   BP 132/69 mmHg  Pulse 86  Temp(Src) 98.1 F (36.7 C) (Oral)  Resp 16  Ht 5\' 7"  (1.702 m)  Wt 136 lb (61.689 kg)  BMI 21.30 kg/m2  SpO2 100% Physical Exam  Constitutional: She appears well-developed and well-nourished. No distress.  HENT:  Head: Normocephalic and atraumatic.  Mouth/Throat: Oropharynx is clear and moist.  Eyes: Conjunctivae and EOM are normal. Pupils are equal, round, and reactive to light.  Neck: Normal range of motion.  Cardiovascular: Normal rate and regular rhythm.   No murmur heard. Pulmonary/Chest: Effort normal and breath sounds normal. No respiratory distress.  Abdominal: Soft. Bowel sounds are normal. There is no tenderness.  Musculoskeletal: Normal range of motion. She exhibits edema and tenderness.  Area of redness tenderness and fluctuance to left proximal inner thigh measuring about 2 x 3 cm. No purulent drainage.  Neurological: She is alert. No cranial nerve deficit. She exhibits normal muscle tone. Coordination normal.  Skin: Skin is warm. There is erythema.  Nursing note and vitals reviewed.   ED Course  Procedures (including critical care time) Labs Review Labs Reviewed - No data to display  Imaging Review No results found.  EKG Interpretation None      MDM   Final diagnoses:  Abscess of left thigh    Highly recommended I&D of the abscess applied the left to provide topical anesthesia. Patient ultimately ended up refusing the I&D she was equivocal at first but then agreed but then the last minute decided she does not want it done. Will treat her with antibodies and warm soaks and hopefully it will drain spontaneously. Patient will return for any  new or worse symptoms.   Vanetta Mulders, MD 04/05/15 6045  Vanetta Mulders, MD 04/05/15 [phone]

## 2015-04-05 NOTE — Discharge Instructions (Signed)
Abscess An abscess (boil or furuncle) is an infected area on or under the skin. This area is filled with yellowish-white fluid (pus) and other material (debris). HOME CARE   Only take medicines as told by your doctor.  If you were given antibiotic medicine, take it as directed. Finish the medicine even if you start to feel better.  If gauze is used, follow your doctor's directions for changing the gauze.  To avoid spreading the infection:  Keep your abscess covered with a bandage.  Wash your hands well.  Do not share personal care items, towels, or whirlpools with others.  Avoid skin contact with others.  Keep your skin and clothes clean around the abscess.  Keep all doctor visits as told. GET HELP RIGHT AWAY IF:   You have more pain, puffiness (swelling), or redness in the wound site.  You have more fluid or blood coming from the wound site.  You have muscle aches, chills, or you feel sick.  You have a fever. MAKE SURE YOU:   Understand these instructions.  Will watch your condition.  Will get help right away if you are not doing well or get worse. Document Released: 02/05/2008 Document Revised: 02/18/2012 Document Reviewed: 11/01/2011 [facility] Patient Information 2015 [place], [place]. This information is not intended to replace advice given to you by your health care provider. Make sure you discuss any questions you have with your health care provider.  As we discussed would recommended the incision and drainage of the abscess understand that you do not want that. Take anabolic as directed soak the left inner thigh and warm tub water for 20 minutes couple times a day. The abscess should drain spontaneously. Return for any new or worse symptoms.

## 2016-04-22 ENCOUNTER — Encounter (HOSPITAL_COMMUNITY): Payer: Self-pay | Admitting: Emergency Medicine

## 2016-04-22 ENCOUNTER — Emergency Department (HOSPITAL_COMMUNITY)
Admission: EM | Admit: 2016-04-22 | Discharge: 2016-04-22 | Disposition: A | Discharge status: Home / Self Care | Payer: Medicaid Other | Attending: Dermatology | Admitting: Dermatology

## 2016-04-22 DIAGNOSIS — Z791 Long term (current) use of non-steroidal anti-inflammatories (NSAID): Secondary | ICD-10-CM | POA: Insufficient documentation

## 2016-04-22 DIAGNOSIS — F17210 Nicotine dependence, cigarettes, uncomplicated: Secondary | ICD-10-CM | POA: Insufficient documentation

## 2016-04-22 DIAGNOSIS — R1030 Lower abdominal pain, unspecified: Secondary | ICD-10-CM | POA: Diagnosis present

## 2016-04-22 DIAGNOSIS — Z5321 Procedure and treatment not carried out due to patient leaving prior to being seen by health care provider: Secondary | ICD-10-CM | POA: Diagnosis not present

## 2016-04-22 DIAGNOSIS — Z79899 Other long term (current) drug therapy: Secondary | ICD-10-CM | POA: Insufficient documentation

## 2016-04-22 NOTE — ED Notes (Signed)
Called to treatment area twice. Waiting room checked, pt not there

## 2016-04-22 NOTE — ED Triage Notes (Signed)
Patient complaining of lower abdominal pain x 2 weeks.

## 2016-09-12 ENCOUNTER — Emergency Department (HOSPITAL_COMMUNITY)
Admission: EM | Admit: 2016-09-12 | Discharge: 2016-09-12 | Disposition: A | Discharge status: Home / Self Care | Payer: Medicaid Other | Attending: Emergency Medicine | Admitting: Emergency Medicine

## 2016-09-12 DIAGNOSIS — F17210 Nicotine dependence, cigarettes, uncomplicated: Secondary | ICD-10-CM | POA: Diagnosis not present

## 2016-09-12 DIAGNOSIS — Z5321 Procedure and treatment not carried out due to patient leaving prior to being seen by health care provider: Secondary | ICD-10-CM | POA: Diagnosis not present

## 2016-09-12 DIAGNOSIS — K0889 Other specified disorders of teeth and supporting structures: Secondary | ICD-10-CM | POA: Diagnosis present

## 2016-09-12 NOTE — ED Notes (Signed)
Per registration, pt seen leaving ED prior to triage.  No answer from waiting area when called for triage

## 2016-09-13 ENCOUNTER — Emergency Department (HOSPITAL_COMMUNITY)
Admission: EM | Admit: 2016-09-13 | Discharge: 2016-09-13 | Disposition: A | Discharge status: Home / Self Care | Payer: Medicaid Other | Attending: Emergency Medicine | Admitting: Emergency Medicine

## 2016-09-13 ENCOUNTER — Encounter (HOSPITAL_COMMUNITY): Payer: Self-pay | Admitting: Emergency Medicine

## 2016-09-13 DIAGNOSIS — F17210 Nicotine dependence, cigarettes, uncomplicated: Secondary | ICD-10-CM | POA: Diagnosis not present

## 2016-09-13 DIAGNOSIS — F17290 Nicotine dependence, other tobacco product, uncomplicated: Secondary | ICD-10-CM | POA: Diagnosis not present

## 2016-09-13 DIAGNOSIS — K0889 Other specified disorders of teeth and supporting structures: Secondary | ICD-10-CM | POA: Insufficient documentation

## 2016-09-13 MED ORDER — HYDROMORPHONE HCL 1 MG/ML IJ SOLN
1.0000 mg | Freq: Once | INTRAMUSCULAR | Status: AC
  Administered 2016-09-13: 1 mg via INTRAVENOUS
  Filled 2016-09-13: qty 1

## 2016-09-13 MED ORDER — ONDANSETRON HCL 4 MG/2ML IJ SOLN
4.0000 mg | Freq: Once | INTRAMUSCULAR | Status: AC
  Administered 2016-09-13: 4 mg via INTRAVENOUS
  Filled 2016-09-13: qty 2

## 2016-09-13 MED ORDER — TRAMADOL HCL 50 MG PO TABS: 50.0000 mg | ORAL_TABLET | Freq: Four times a day (QID) | ORAL | 0 refills | Status: DC | PRN

## 2016-09-13 MED ORDER — CLINDAMYCIN PHOSPHATE 600 MG/50ML IV SOLN
600.0000 mg | Freq: Once | INTRAVENOUS | Status: AC
  Administered 2016-09-13: 600 mg via INTRAVENOUS
  Filled 2016-09-13: qty 50

## 2016-09-13 MED ORDER — KETOROLAC TROMETHAMINE 30 MG/ML IJ SOLN
30.0000 mg | Freq: Once | INTRAMUSCULAR | Status: AC
  Administered 2016-09-13: 30 mg via INTRAVENOUS
  Filled 2016-09-13: qty 1

## 2016-09-13 MED ORDER — PENICILLIN V POTASSIUM 500 MG PO TABS: 500.0000 mg | ORAL_TABLET | Freq: Four times a day (QID) | ORAL | 0 refills | Status: AC

## 2016-09-13 NOTE — Discharge Instructions (Signed)
Follow up with a dentist next week. °

## 2016-09-13 NOTE — ED Triage Notes (Signed)
Patient complains of jaw pain and swelling x 2 days. Swelling to left lower jaw. Patient denies swallowing or breathing difficulty. NAD.

## 2016-09-13 NOTE — ED Provider Notes (Signed)
[facility] Provider Note   CSN: [id number] Arrival date & time: 09/13/16  0735     History   Chief Complaint Chief Complaint  Patient presents with  . Dental Pain    HPI Denise Clark is a 26 y.o. female.  Patient complains of swelling and pain to the left lower jaw.   The history is provided by the patient. No language interpreter was used.  Dental Pain   This is a new problem. The current episode started more than 2 days ago. The problem occurs constantly. The problem has not changed since onset.The pain is at a severity of 6/10. The pain is moderate. She has tried nothing for the symptoms.    History reviewed. No pertinent past medical history.  There are no active problems to display for this patient.   Past Surgical History:  Procedure Laterality Date  . CESAREAN SECTION    . ovarian pregnancy    . TUBAL LIGATION      OB History    Gravida Para Term Preterm AB Living             1   SAB TAB Ectopic Multiple Live Births                   Home Medications    Prior to Admission medications   Medication Sig Start Date End Date Taking? Authorizing Provider  ibuprofen (ADVIL,MOTRIN) 200 MG tablet Take 200-400 mg by mouth every 6 (six) hours as needed. Pain    Yes Historical Provider, MD  etonogestrel (IMPLANON) 68 MG IMPL implant 1 each by Subdermal route once.    Historical Provider, MD  penicillin v potassium (VEETID) 500 MG tablet Take 1 tablet (500 mg total) by mouth 4 (four) times daily. 09/13/16 09/20/16  Bethann Berkshire, MD  traMADol (ULTRAM) 50 MG tablet Take 1 tablet (50 mg total) by mouth every 6 (six) hours as needed. 09/13/16   Bethann Berkshire, MD    Family History No family history on file.  Social History Social History  Substance Use Topics  . Smoking status: Current Some Day Smoker    Packs/day: 0.05    Types: Cigars, Cigarettes  . Smokeless tobacco: Never Used  . Alcohol use No     Allergies   Patient has no known  allergies.   Review of Systems Review of Systems  Constitutional: Negative for appetite change and fatigue.  HENT: Negative for congestion, ear discharge and sinus pressure.        Toothache and jaw pain  Eyes: Negative for discharge.  Respiratory: Negative for cough.   Cardiovascular: Negative for chest pain.  Gastrointestinal: Negative for abdominal pain and diarrhea.  Genitourinary: Negative for frequency and hematuria.  Musculoskeletal: Negative for back pain.  Skin: Negative for rash.  Neurological: Negative for seizures and headaches.  Psychiatric/Behavioral: Negative for hallucinations.     Physical Exam Updated Vital Signs BP 133/87 (BP Location: Left Arm)   Pulse 87   Temp 98.8 F (37.1 C) (Oral)   Resp 16   Ht 5\' 3"  (1.6 m)   Wt 140 lb (63.5 kg)   SpO2 100%   BMI 24.80 kg/m   Physical Exam  Constitutional: She is oriented to person, place, and time. She appears well-developed.  HENT:  Head: Normocephalic.  Patient has swelling to left lower jaw with tenderness to molar on the left side  Eyes: Conjunctivae are normal.  Neck: No tracheal deviation present.  Cardiovascular:  No murmur heard.  Musculoskeletal: Normal range of motion.  Neurological: She is oriented to person, place, and time.  Skin: Skin is warm.  Psychiatric: She has a normal mood and affect.     ED Treatments / Results  Labs (all labs ordered are listed, but only abnormal results are displayed) Labs Reviewed - No data to display  EKG  EKG Interpretation None       Radiology No results found.  Procedures Procedures (including critical care time)  Medications Ordered in ED Medications  clindamycin (CLEOCIN) IVPB 600 mg (0 mg Intravenous Stopped 09/13/16 0847)  HYDROmorphone (DILAUDID) injection 1 mg (1 mg Intravenous Given 09/13/16 0815)  ondansetron (ZOFRAN) injection 4 mg (4 mg Intravenous Given 09/13/16 0815)  ketorolac (TORADOL) 30 MG/ML injection 30 mg (30 mg Intravenous  Given 09/13/16 0855)     Initial Impression / Assessment and Plan / ED Course  I have reviewed the triage vital signs and the nursing notes.  Pertinent labs & imaging results that were available during my care of the patient were reviewed by me and considered in my medical decision making (see chart for details).  Clinical Course     Patient with abscessed tooth. She was given Cleocin IV here and will be put on penicillin and Ultram. She is referred to a dentist next week.  Final Clinical Impressions(s) / ED Diagnoses   Final diagnoses:  Pain, dental    New Prescriptions New Prescriptions   PENICILLIN V POTASSIUM (VEETID) 500 MG TABLET    Take 1 tablet (500 mg total) by mouth 4 (four) times daily.   TRAMADOL (ULTRAM) 50 MG TABLET    Take 1 tablet (50 mg total) by mouth every 6 (six) hours as needed.     Bethann BerkshireJoseph Peta Peachey, MD 09/13/16 [phone]

## 2016-10-09 ENCOUNTER — Emergency Department (HOSPITAL_COMMUNITY)
Admission: EM | Admit: 2016-10-09 | Discharge: 2016-10-09 | Disposition: A | Discharge status: Home / Self Care | Payer: Medicaid Other | Attending: Emergency Medicine | Admitting: Emergency Medicine

## 2016-10-09 ENCOUNTER — Encounter (HOSPITAL_COMMUNITY): Payer: Self-pay | Admitting: Emergency Medicine

## 2016-10-09 DIAGNOSIS — F17290 Nicotine dependence, other tobacco product, uncomplicated: Secondary | ICD-10-CM | POA: Insufficient documentation

## 2016-10-09 DIAGNOSIS — H578 Other specified disorders of eye and adnexa: Secondary | ICD-10-CM | POA: Insufficient documentation

## 2016-10-09 DIAGNOSIS — K047 Periapical abscess without sinus: Secondary | ICD-10-CM | POA: Insufficient documentation

## 2016-10-09 DIAGNOSIS — Z79899 Other long term (current) drug therapy: Secondary | ICD-10-CM | POA: Diagnosis not present

## 2016-10-09 DIAGNOSIS — F17210 Nicotine dependence, cigarettes, uncomplicated: Secondary | ICD-10-CM | POA: Diagnosis not present

## 2016-10-09 DIAGNOSIS — K0889 Other specified disorders of teeth and supporting structures: Secondary | ICD-10-CM | POA: Diagnosis present

## 2016-10-09 MED ORDER — AMOXICILLIN 250 MG PO CAPS
500.0000 mg | ORAL_CAPSULE | Freq: Once | ORAL | Status: AC
  Administered 2016-10-09: 500 mg via ORAL
  Filled 2016-10-09: qty 2

## 2016-10-09 MED ORDER — IBUPROFEN 600 MG PO TABS: 600.0000 mg | ORAL_TABLET | Freq: Four times a day (QID) | ORAL | 0 refills | Status: DC | PRN

## 2016-10-09 MED ORDER — HYDROCODONE-ACETAMINOPHEN 5-325 MG PO TABS
1.0000 | ORAL_TABLET | Freq: Once | ORAL | Status: AC
  Administered 2016-10-09: 1 via ORAL
  Filled 2016-10-09: qty 1

## 2016-10-09 MED ORDER — AMOXICILLIN 500 MG PO CAPS: 500.0000 mg | ORAL_CAPSULE | Freq: Three times a day (TID) | ORAL | 0 refills | Status: AC

## 2016-10-09 MED ORDER — HYDROCODONE-ACETAMINOPHEN 5-325 MG PO TABS: 1.0000 | ORAL_TABLET | ORAL | 0 refills | Status: DC | PRN

## 2016-10-09 NOTE — ED Triage Notes (Signed)
PT c/o left lower dental pain recurrent x1 day with left sided lower facial swelling. PT states she has a dental appt on 10/22/16.

## 2016-10-09 NOTE — ED Provider Notes (Signed)
[facility] Provider Note   CSN: [id number] Arrival date & time: 10/09/16  0850     History   Chief Complaint Chief Complaint  Patient presents with  . Dental Pain    HPI Denise Clark is a 26 y.o. female presenting with swelling and pain along her left lower jawline which flared again yesterday.  She has a known dental cavity with abscess occurring last month but resolved after antibiotic treatment here.  She arranged dental care after that event with the health dept but her appt is not until 2/20 at which time an extraction is planned.    There has been no fevers, chills, nausea or vomiting, also no complaint of difficulty swallowing, although chewing makes pain worse.  The patient has tried ibu 400 mg without relief of symptoms.    .  The history is provided by the patient.    History reviewed. No pertinent past medical history.  There are no active problems to display for this patient.   Past Surgical History:  Procedure Laterality Date  . CESAREAN SECTION    . ovarian pregnancy      OB History    Gravida Para Term Preterm AB Living   2       1 1    SAB TAB Ectopic Multiple Live Births       1           Home Medications    Prior to Admission medications   Medication Sig Start Date End Date Taking? Authorizing Provider  etonogestrel (IMPLANON) 68 MG IMPL implant 1 each by Subdermal route once.   Yes Historical Provider, MD  traMADol (ULTRAM) 50 MG tablet Take 1 tablet (50 mg total) by mouth every 6 (six) hours as needed. 09/13/16  Yes Bethann BerkshireJoseph Zammit, MD  amoxicillin (AMOXIL) 500 MG capsule Take 1 capsule (500 mg total) by mouth 3 (three) times daily. 10/09/16 10/19/16  Burgess AmorJulie Naveen Lorusso, PA-C  HYDROcodone-acetaminophen (NORCO/VICODIN) 5-325 MG tablet Take 1 tablet by mouth every 4 (four) hours as needed. 10/09/16   Burgess AmorJulie Dionta Larke, PA-C  ibuprofen (ADVIL,MOTRIN) 600 MG tablet Take 1 tablet (600 mg total) by mouth every 6 (six) hours as needed. 10/09/16   Burgess AmorJulie Lasha Echeverria, PA-C     Family History History reviewed. No pertinent family history.  Social History Social History  Substance Use Topics  . Smoking status: Current Some Day Smoker    Packs/day: 0.05    Types: Cigars, Cigarettes  . Smokeless tobacco: Never Used  . Alcohol use No     Allergies   Patient has no known allergies.   Review of Systems Review of Systems  Constitutional: Negative for fever.  HENT: Positive for dental problem. Negative for facial swelling and sore throat.   Respiratory: Negative for shortness of breath.   Musculoskeletal: Negative for neck pain and neck stiffness.     Physical Exam Updated Vital Signs BP 115/77 (BP Location: Left Arm)   Pulse 80   Temp 97.8 F (36.6 C) (Oral)   Resp 18   Ht 5\' 3"  (1.6 m)   Wt 63.5 kg   LMP 10/01/2016   SpO2 100%   BMI 24.80 kg/m   Physical Exam  Constitutional: She is oriented to person, place, and time. She appears well-developed and well-nourished. No distress.  HENT:  Head: Normocephalic. Head is with raccoon's eyes.  Right Ear: Tympanic membrane and external ear normal.  Left Ear: Tympanic membrane and external ear normal.  Mouth/Throat: Oropharynx is clear and moist  and mucous membranes are normal. No oral lesions. No trismus in the jaw. Dental abscesses present. No uvula swelling.    Sublingual space is soft.  Eyes: Conjunctivae are normal.  Neck: Normal range of motion. Neck supple.  Cardiovascular: Normal rate and normal heart sounds.   Pulmonary/Chest: Effort normal.  Abdominal: She exhibits no distension.  Musculoskeletal: Normal range of motion.  Lymphadenopathy:    She has no cervical adenopathy.  Neurological: She is alert and oriented to person, place, and time.  Skin: Skin is warm and dry. No erythema.  Psychiatric: She has a normal mood and affect.     ED Treatments / Results  Labs (all labs ordered are listed, but only abnormal results are displayed) Labs Reviewed - No data to  display  EKG  EKG Interpretation None       Radiology No results found.  Procedures Procedures (including critical care time)  Medications Ordered in ED Medications  HYDROcodone-acetaminophen (NORCO/VICODIN) 5-325 MG per tablet 1 tablet (not administered)  amoxicillin (AMOXIL) capsule 500 mg (not administered)     Initial Impression / Assessment and Plan / ED Course  I have reviewed the triage vital signs and the nursing notes.  Pertinent labs & imaging results that were available during my care of the patient were reviewed by me and considered in my medical decision making (see chart for details).     Amoxil, hydrocodone, ibuprofen.  F/u with dentistry as planned, returning here for any worsened sx.  Final Clinical Impressions(s) / ED Diagnoses   Final diagnoses:  Dental abscess    New Prescriptions New Prescriptions   AMOXICILLIN (AMOXIL) 500 MG CAPSULE    Take 1 capsule (500 mg total) by mouth 3 (three) times daily.   HYDROCODONE-ACETAMINOPHEN (NORCO/VICODIN) 5-325 MG TABLET    Take 1 tablet by mouth every 4 (four) hours as needed.   IBUPROFEN (ADVIL,MOTRIN) 600 MG TABLET    Take 1 tablet (600 mg total) by mouth every 6 (six) hours as needed.     Burgess Amor, PA-C 10/09/16 1012    Samuel Jester, DO 10/12/16 2136

## 2016-10-09 NOTE — Discharge Instructions (Signed)
Complete your entire course of antibiotics as prescribed.  You  may use the hydrocodone for pain relief but do not drive within 4 hours of taking as this will make you drowsy.  Avoid applying heat or ice to this abscess area which can worsen your symptoms.  You may use warm salt water swish and spit treatment or half peroxide and water swish and spit after meals to keep this area clean as discussed.  Keep you appointment as you have scheduled.

## 2016-10-22 ENCOUNTER — Other Ambulatory Visit: Payer: Self-pay | Admitting: Adult Health

## 2016-11-03 ENCOUNTER — Encounter (HOSPITAL_COMMUNITY): Payer: Self-pay | Admitting: Emergency Medicine

## 2016-11-03 ENCOUNTER — Emergency Department (HOSPITAL_COMMUNITY)
Admission: EM | Admit: 2016-11-03 | Discharge: 2016-11-03 | Disposition: A | Discharge status: Home / Self Care | Payer: Medicaid Other | Attending: Emergency Medicine | Admitting: Emergency Medicine

## 2016-11-03 ENCOUNTER — Emergency Department (HOSPITAL_COMMUNITY): Payer: Medicaid Other

## 2016-11-03 DIAGNOSIS — F17210 Nicotine dependence, cigarettes, uncomplicated: Secondary | ICD-10-CM | POA: Insufficient documentation

## 2016-11-03 DIAGNOSIS — B349 Viral infection, unspecified: Secondary | ICD-10-CM | POA: Insufficient documentation

## 2016-11-03 DIAGNOSIS — N390 Urinary tract infection, site not specified: Secondary | ICD-10-CM | POA: Diagnosis not present

## 2016-11-03 DIAGNOSIS — R1110 Vomiting, unspecified: Secondary | ICD-10-CM | POA: Diagnosis present

## 2016-11-03 LAB — URINALYSIS, ROUTINE W REFLEX MICROSCOPIC
BILIRUBIN URINE: NEGATIVE
Glucose, UA: NEGATIVE mg/dL
KETONES UR: NEGATIVE mg/dL
NITRITE: POSITIVE — AB
PROTEIN: NEGATIVE mg/dL
Specific Gravity, Urine: 1.013 (ref 1.005–1.030)
pH: 6 (ref 5.0–8.0)

## 2016-11-03 LAB — PREGNANCY, URINE: Preg Test, Ur: NEGATIVE

## 2016-11-03 MED ORDER — CEPHALEXIN 500 MG PO CAPS: 500.0000 mg | ORAL_CAPSULE | Freq: Four times a day (QID) | ORAL | 0 refills | Status: DC

## 2016-11-03 MED ORDER — ACETAMINOPHEN 325 MG PO TABS
650.0000 mg | ORAL_TABLET | Freq: Once | ORAL | Status: AC
  Administered 2016-11-03: 650 mg via ORAL
  Filled 2016-11-03: qty 2

## 2016-11-03 MED ORDER — ONDANSETRON 8 MG PO TBDP
8.0000 mg | ORAL_TABLET | Freq: Once | ORAL | Status: AC
  Administered 2016-11-03: 8 mg via ORAL
  Filled 2016-11-03: qty 1

## 2016-11-03 MED ORDER — IBUPROFEN 400 MG PO TABS
400.0000 mg | ORAL_TABLET | Freq: Once | ORAL | Status: AC
  Administered 2016-11-03: 400 mg via ORAL
  Filled 2016-11-03: qty 1

## 2016-11-03 MED ORDER — ONDANSETRON 4 MG PO TBDP: 4.0000 mg | ORAL_TABLET | Freq: Three times a day (TID) | ORAL | 0 refills | Status: DC | PRN

## 2016-11-03 NOTE — ED Notes (Signed)
Pt reports that she is followed at the health dept for her care

## 2016-11-03 NOTE — ED Notes (Signed)
[facility] drawn here last week

## 2016-11-03 NOTE — ED Notes (Signed)
Pt refused lab drawn after stating that she was terrified of needles.Dr Judie PetitM in to discuss with pt- why she needs lab draw Urine collected and to lab

## 2016-11-03 NOTE — ED Notes (Signed)
Dr Marisue IvanMcManess in to assess- pt requested something to drink and ice chips provided

## 2016-11-03 NOTE — ED Notes (Signed)
Pt angry and upset that she has not been given any prescription for pain meds - She initially refused her prescriptions for N and antibiotic but her companion retrieved them. She refused to sign her discharge summary and ambulated out heel to toe with easy stride and no hitch nor unevenness of gait

## 2016-11-03 NOTE — ED Notes (Signed)
Pt has vomited x 1 today with N for the last 2 days- strong smell of marijuana in room  Pt reports that she is keeping down water but has not eaten food

## 2016-11-03 NOTE — ED Notes (Signed)
From Radiology 

## 2016-11-03 NOTE — ED Notes (Signed)
Pt refused to sign discharge instructions. Unhappy that they were here for two hours and "nothing was done"- pt reminded that she refused her lab, that she is receiving prescriptions for antibiotics and N, and is recommended OTC pain meds- She is unhappy that she is not being given a prescription for pain meds

## 2016-11-03 NOTE — ED Triage Notes (Signed)
Pt reports cough, fever, weakness, bodyaches, and emesis x2 days.  Pt alert and oriented.

## 2016-11-03 NOTE — ED Notes (Signed)
Pt and companion increasingly upset with length of stay- reassurances and apologies for wait- pt has had three cups of water without vomiting and states her N is better but that she continues to have pain 10/10 in her back

## 2016-11-03 NOTE — ED Provider Notes (Signed)
[facility]Y DEPT Provider Note   CSN: [id number] Arrival date & time: 11/03/16  1519     History   Chief Complaint Chief Complaint  Patient presents with  . Emesis    HPI Denise Clark is a 26 y.o. female.  HPI  Pt was seen at 1610.  Per pt, c/o gradual onset and persistence of constant runny/stuffy nose, sinus congestion, ears congestion, generalized body aches, N/V/D, "chills," and cough for the past 2 days.  Denies objective fevers, no rash, no CP/SOB, no abd pain.     History reviewed. No pertinent past medical history.  There are no active problems to display for this patient.   Past Surgical History:  Procedure Laterality Date  . CESAREAN SECTION    . ovarian pregnancy      OB History    Gravida Para Term Preterm AB Living   2       1 1    SAB TAB Ectopic Multiple Live Births       1           Home Medications    Prior to Admission medications   Medication Sig Start Date End Date Taking? Authorizing Provider  etonogestrel (IMPLANON) 68 MG IMPL implant 1 each by Subdermal route once.    Historical Provider, MD  HYDROcodone-acetaminophen (NORCO/VICODIN) 5-325 MG tablet Take 1 tablet by mouth every 4 (four) hours as needed. 10/09/16   Burgess Amor, PA-C  ibuprofen (ADVIL,MOTRIN) 600 MG tablet Take 1 tablet (600 mg total) by mouth every 6 (six) hours as needed. 10/09/16   Burgess Amor, PA-C  traMADol (ULTRAM) 50 MG tablet Take 1 tablet (50 mg total) by mouth every 6 (six) hours as needed. 09/13/16   Bethann Berkshire, MD    Family History History reviewed. No pertinent family history.  Social History Social History  Substance Use Topics  . Smoking status: Current Some Day Smoker    Packs/day: 0.05    Types: Cigars, Cigarettes  . Smokeless tobacco: Never Used  . Alcohol use No     Allergies   Patient has no known allergies.   Review of Systems Review of Systems ROS: Statement: All systems negative except as marked or noted in the HPI; Constitutional:  Negative for objective fever and +generalized body aches, chills. ; ; Eyes: Negative for eye pain, redness and discharge. ; ; ENMT: Negative for sore throat, hoarseness, +ears congestion, nasal congestion, sinus pressure. ; ; Cardiovascular: Negative for chest pain, palpitations, diaphoresis, dyspnea and peripheral edema. ; ; Respiratory: +cough. Negative for wheezing and stridor. ; ; Gastrointestinal: +N/V/D. Negative for abdominal pain, blood in stool, hematemesis, jaundice and rectal bleeding. . ; ; Genitourinary: Negative for dysuria, flank pain and hematuria. ; ; Musculoskeletal: Negative for back pain and neck pain. Negative for swelling and trauma.; ; Skin: Negative for pruritus, rash, abrasions, blisters, bruising and skin lesion.; ; Neuro: Negative for headache, lightheadedness and neck stiffness. Negative for weakness, altered level of consciousness, altered mental status, extremity weakness, paresthesias, involuntary movement, seizure and syncope.      Physical Exam Updated Vital Signs BP 132/76 (BP Location: Right Arm)   Pulse 93   Temp 100.6 F (38.1 C) (Oral)   Resp 18   Ht 5\' 3"  (1.6 m)   Wt 140 lb (63.5 kg)   SpO2 100%   BMI 24.80 kg/m   Physical Exam 1615: Physical examination:  Nursing notes reviewed; Vital signs and O2 SAT reviewed;  Constitutional: Well developed, Well nourished, Well hydrated,  In no acute distress; Head:  Normocephalic, atraumatic; Eyes: EOMI, PERRL, No scleral icterus; ENMT: TM's clear bilat. +edemetous nasal turbinates bilat with clear rhinorrhea. Mouth and pharynx without lesions. No tonsillar exudates. No intra-oral edema. No submandibular or sublingual edema. No hoarse voice, no drooling, no stridor. No pain with manipulation of larynx. No trismus. Mouth and pharynx normal, Mucous membranes moist; Neck: Supple, Full range of motion, No lymphadenopathy. No meningeal signs.; Cardiovascular: Regular rate and rhythm, No murmur, rub, or gallop; Respiratory:  Breath sounds clear & equal bilaterally, No rales, rhonchi, wheezes.  Speaking full sentences with ease, Normal respiratory effort/excursion; Chest: Nontender, Movement normal; Abdomen: Soft, Nontender, Nondistended, Normal bowel sounds; Genitourinary: No CVA tenderness; Extremities: Pulses normal, No tenderness, No edema, No calf edema or asymmetry.; Neuro: AA&Ox3, Major CN grossly intact.  Speech clear. No gross focal motor or sensory deficits in extremities.; Skin: Color normal, Warm, Dry.   ED Treatments / Results  Labs (all labs ordered are listed, but only abnormal results are displayed)   EKG  EKG Interpretation None       Radiology    Procedures Procedures (including critical care time)  Medications Ordered in ED Medications  ondansetron (ZOFRAN-ODT) disintegrating tablet 8 mg (8 mg Oral Given 11/03/16 1625)  acetaminophen (TYLENOL) tablet 650 mg (650 mg Oral Given 11/03/16 1625)     Initial Impression / Assessment and Plan / ED Course  I have reviewed the triage vital signs and the nursing notes.  Pertinent labs & imaging results that were available during my care of the patient were reviewed by me and considered in my medical decision making (see chart for details).  MDM Reviewed: previous chart, nursing note and vitals Interpretation: x-ray and labs   Results for orders placed or performed during the hospital encounter of 11/03/16  Pregnancy, urine  Result Value Ref Range   Preg Test, Ur NEGATIVE NEGATIVE  Urinalysis, Routine w reflex microscopic  Result Value Ref Range   Color, Urine YELLOW YELLOW   APPearance CLEAR CLEAR   Specific Gravity, Urine 1.013 1.005 - 1.030   pH 6.0 5.0 - 8.0   Glucose, UA NEGATIVE NEGATIVE mg/dL   Hgb urine dipstick SMALL (A) NEGATIVE   Bilirubin Urine NEGATIVE NEGATIVE   Ketones, ur NEGATIVE NEGATIVE mg/dL   Protein, ur NEGATIVE NEGATIVE mg/dL   Nitrite POSITIVE (A) NEGATIVE   Leukocytes, UA TRACE (A) NEGATIVE   RBC / HPF  0-5 0 - 5 RBC/hpf   WBC, UA 6-30 0 - 5 WBC/hpf   Bacteria, UA FEW (A) NONE SEEN   Dg Abd Acute W/chest Result Date: 11/03/2016 CLINICAL DATA:  Nausea, vomiting, and diarrhea.  Cough and fever. EXAM: DG ABDOMEN ACUTE W/ 1V CHEST COMPARISON:  Chest x-ray April 22, 2009 FINDINGS: The chest is normal in appearance.  No evidence of pneumonia. No free air, portal venous gas, or pneumatosis. The bowel gas pattern is nonobstructive. No acute abnormalities identified. IMPRESSION: Negative abdominal radiographs.  No acute cardiopulmonary disease. Electronically Signed   By: Gerome Samavid  Williams III M.D   On: 11/03/2016 17:18    1635:  Pt refuses lab draw, stating she was "terrifed" of needles. Risks/benefits of checking labs explained to pt. Pt verb understanding. Pt makes her own medical decisions. Pt continues to refuse labs draw, understanding the consequences of her decision.   1730:   Pt has tol PO well while in the ED without N/V.  No stooling while in the ED.  Abd remains benign, VSS. Feels better  and wants to go home now.  Continually asking for "some pain meds" for her "body aches." Tylenol and motrin given; pt aware she will not receive narcotics. Pt then got upset stated she is ready leave. Tx for UTI. Dx and testing d/w pt and family.  Questions answered.  Verb understanding, agreeable to d/c home with outpt f/u.    Final Clinical Impressions(s) / ED Diagnoses   Final diagnoses:  None    New Prescriptions New Prescriptions   No medications on file     Samuel Jester, DO 11/08/16 1243

## 2016-11-03 NOTE — Discharge Instructions (Signed)
Take the prescriptions as directed.  Take over the counter tylenol and ibuprofen, as directed on packaging, as needed for discomfort. Apply moist heat or ice to the area(s) of discomfort, for 15 minutes at a time, several times per day for the next few days.  Do not fall asleep on a heating or ice pack. Increase your fluid intake (ie:  Gatoraide) for the next few days, as discussed.  Eat a bland diet and advance to your regular diet slowly as you can tolerate it.   Avoid full strength juices, as well as milk and milk products until your diarrhea has resolved.   Call your regular medical doctor Monday to schedule a follow up appointment in the next 2 days.  Return to the Emergency Department immediately sooner if worsening.

## 2017-01-06 ENCOUNTER — Emergency Department (HOSPITAL_COMMUNITY)
Admission: EM | Admit: 2017-01-06 | Discharge: 2017-01-06 | Disposition: A | Discharge status: Home / Self Care | Payer: Medicaid Other | Attending: Emergency Medicine | Admitting: Emergency Medicine

## 2017-01-06 ENCOUNTER — Encounter (HOSPITAL_COMMUNITY): Payer: Self-pay | Admitting: Cardiology

## 2017-01-06 DIAGNOSIS — K029 Dental caries, unspecified: Secondary | ICD-10-CM | POA: Insufficient documentation

## 2017-01-06 DIAGNOSIS — F17290 Nicotine dependence, other tobacco product, uncomplicated: Secondary | ICD-10-CM | POA: Insufficient documentation

## 2017-01-06 DIAGNOSIS — F17210 Nicotine dependence, cigarettes, uncomplicated: Secondary | ICD-10-CM | POA: Diagnosis not present

## 2017-01-06 DIAGNOSIS — K0889 Other specified disorders of teeth and supporting structures: Secondary | ICD-10-CM

## 2017-01-06 MED ORDER — IBUPROFEN 800 MG PO TABS: 800.0000 mg | ORAL_TABLET | Freq: Three times a day (TID) | ORAL | 0 refills | Status: DC

## 2017-01-06 MED ORDER — CLINDAMYCIN HCL 300 MG PO CAPS: 300.0000 mg | ORAL_CAPSULE | Freq: Four times a day (QID) | ORAL | 0 refills | Status: DC

## 2017-01-06 NOTE — ED Triage Notes (Signed)
Dental pain since last night

## 2017-01-06 NOTE — ED Provider Notes (Signed)
[facility] Provider Note   CSN: [id number] Arrival date & time: 01/06/17  6213     History   Chief Complaint Chief Complaint  Patient presents with  . Dental Pain    HPI Denise Clark is a 26 y.o. female.  HPI   Denise Clark is a 26 y.o. female who presents to the Emergency Department complaining of dental pain.  She complains of sharp, throbbing pain to her right upper teeth that began last evening.  Describes pain as constant.  States she took one tylenol yesterday without relief.  Pain is worse with chewing and sensation of cold foods or liquids.  She denies fever, neck pain, facial swelling or difficulty swallowing.   History reviewed. No pertinent past medical history.  There are no active problems to display for this patient.   Past Surgical History:  Procedure Laterality Date  . CESAREAN SECTION    . ovarian pregnancy      OB History    Gravida Para Term Preterm AB Living   2       1 1    SAB TAB Ectopic Multiple Live Births       1           Home Medications    Prior to Admission medications   Medication Sig Start Date End Date Taking? Authorizing Provider  cephALEXin (KEFLEX) 500 MG capsule Take 1 capsule (500 mg total) by mouth 4 (four) times daily. 11/03/16   Samuel Jester, DO  etonogestrel (IMPLANON) 68 MG IMPL implant 1 each by Subdermal route once.    [provider]  HYDROcodone-acetaminophen (NORCO/VICODIN) 5-325 MG tablet Take 1 tablet by mouth every 4 (four) hours as needed. Patient not taking: Reported on 11/03/2016 10/09/16   Burgess Amor, PA-C  ibuprofen (ADVIL,MOTRIN) 600 MG tablet Take 1 tablet (600 mg total) by mouth every 6 (six) hours as needed. Patient not taking: Reported on 11/03/2016 10/09/16   Burgess Amor, PA-C  ondansetron (ZOFRAN ODT) 4 MG disintegrating tablet Take 1 tablet (4 mg total) by mouth every 8 (eight) hours as needed for nausea or vomiting. 11/03/16   Samuel Jester, DO  traMADol (ULTRAM) 50 MG tablet  Take 1 tablet (50 mg total) by mouth every 6 (six) hours as needed. Patient not taking: Reported on 11/03/2016 09/13/16   Bethann Berkshire, MD    Family History History reviewed. No pertinent family history.  Social History Social History  Substance Use Topics  . Smoking status: Current Some Day Smoker    Packs/day: 0.05    Types: Cigars, Cigarettes  . Smokeless tobacco: Never Used  . Alcohol use No     Allergies   Patient has no known allergies.   Review of Systems Review of Systems  Constitutional: Negative for appetite change and fever.  HENT: Positive for dental problem. Negative for congestion, facial swelling, sore throat and trouble swallowing.   Eyes: Negative for pain and visual disturbance.  Musculoskeletal: Negative for neck pain and neck stiffness.  Neurological: Negative for dizziness, facial asymmetry and headaches.  Hematological: Negative for adenopathy.  All other systems reviewed and are negative.    Physical Exam Updated Vital Signs BP 123/85   Pulse 86   Temp 98.5 F (36.9 C) (Oral)   Resp 18   Ht 5\' 6"  (1.676 m)   Wt 62.6 kg   SpO2 100%   BMI 22.27 kg/m   Physical Exam  Constitutional: She is oriented to person, place, and time. She appears well-developed  and well-nourished. No distress.  HENT:  Head: Normocephalic and atraumatic.  Right Ear: Tympanic membrane and ear canal normal.  Left Ear: Tympanic membrane and ear canal normal.  Mouth/Throat: Uvula is midline, oropharynx is clear and moist and mucous membranes are normal. No trismus in the jaw. Dental caries present. No dental abscesses or uvula swelling.  Tenderness and dental caries of the right upper second molar.  No facial swelling, obvious dental abscess, trismus, or sublingual abnml.    Neck: Normal range of motion. Neck supple.  Cardiovascular: Normal rate, regular rhythm and normal heart sounds.   No murmur heard. Pulmonary/Chest: Effort normal and breath sounds normal.    Musculoskeletal: Normal range of motion.  Lymphadenopathy:    She has no cervical adenopathy.  Neurological: She is alert and oriented to person, place, and time. She exhibits normal muscle tone. Coordination normal.  Skin: Skin is warm and dry.  Nursing note and vitals reviewed.    ED Treatments / Results  Labs (all labs ordered are listed, but only abnormal results are displayed) Labs Reviewed - No data to display  EKG  EKG Interpretation None       Radiology No results found.  Procedures Procedures (including critical care time)  Medications Ordered in ED Medications - No data to display   Initial Impression / Assessment and Plan / ED Course  I have reviewed the triage vital signs and the nursing notes.  Pertinent labs & imaging results that were available during my care of the patient were reviewed by me and considered in my medical decision making (see chart for details).     Pt well appearing.  Vitals stable.  No concerning sx's for dental abscess or Ludwig's angina.  Rx for clinda and ibuprofen.  Referral info given for local dentistry  Final Clinical Impressions(s) / ED Diagnoses   Final diagnoses:  Pain, dental    New Prescriptions New Prescriptions   No medications on file     Rosey Bathriplett, Talis Iwan, PA-C 01/06/17 [id number]    Eber HongMiller, Brian, MD 01/12/17 2021

## 2017-01-06 NOTE — Discharge Instructions (Signed)
Call one of the dentists on the list provided.  Also try using OTC Oragel as directed

## 2017-04-11 IMAGING — DX DG ABDOMEN ACUTE W/ 1V CHEST
3 series · 3 of 3 positions shown · non-contrast
Comparison: Chest x-ray April 22, 2009

CLINICAL DATA: Nausea, vomiting, and diarrhea.  Cough and fever.

EXAM:
DG ABDOMEN ACUTE W/ 1V CHEST

[chest pa]
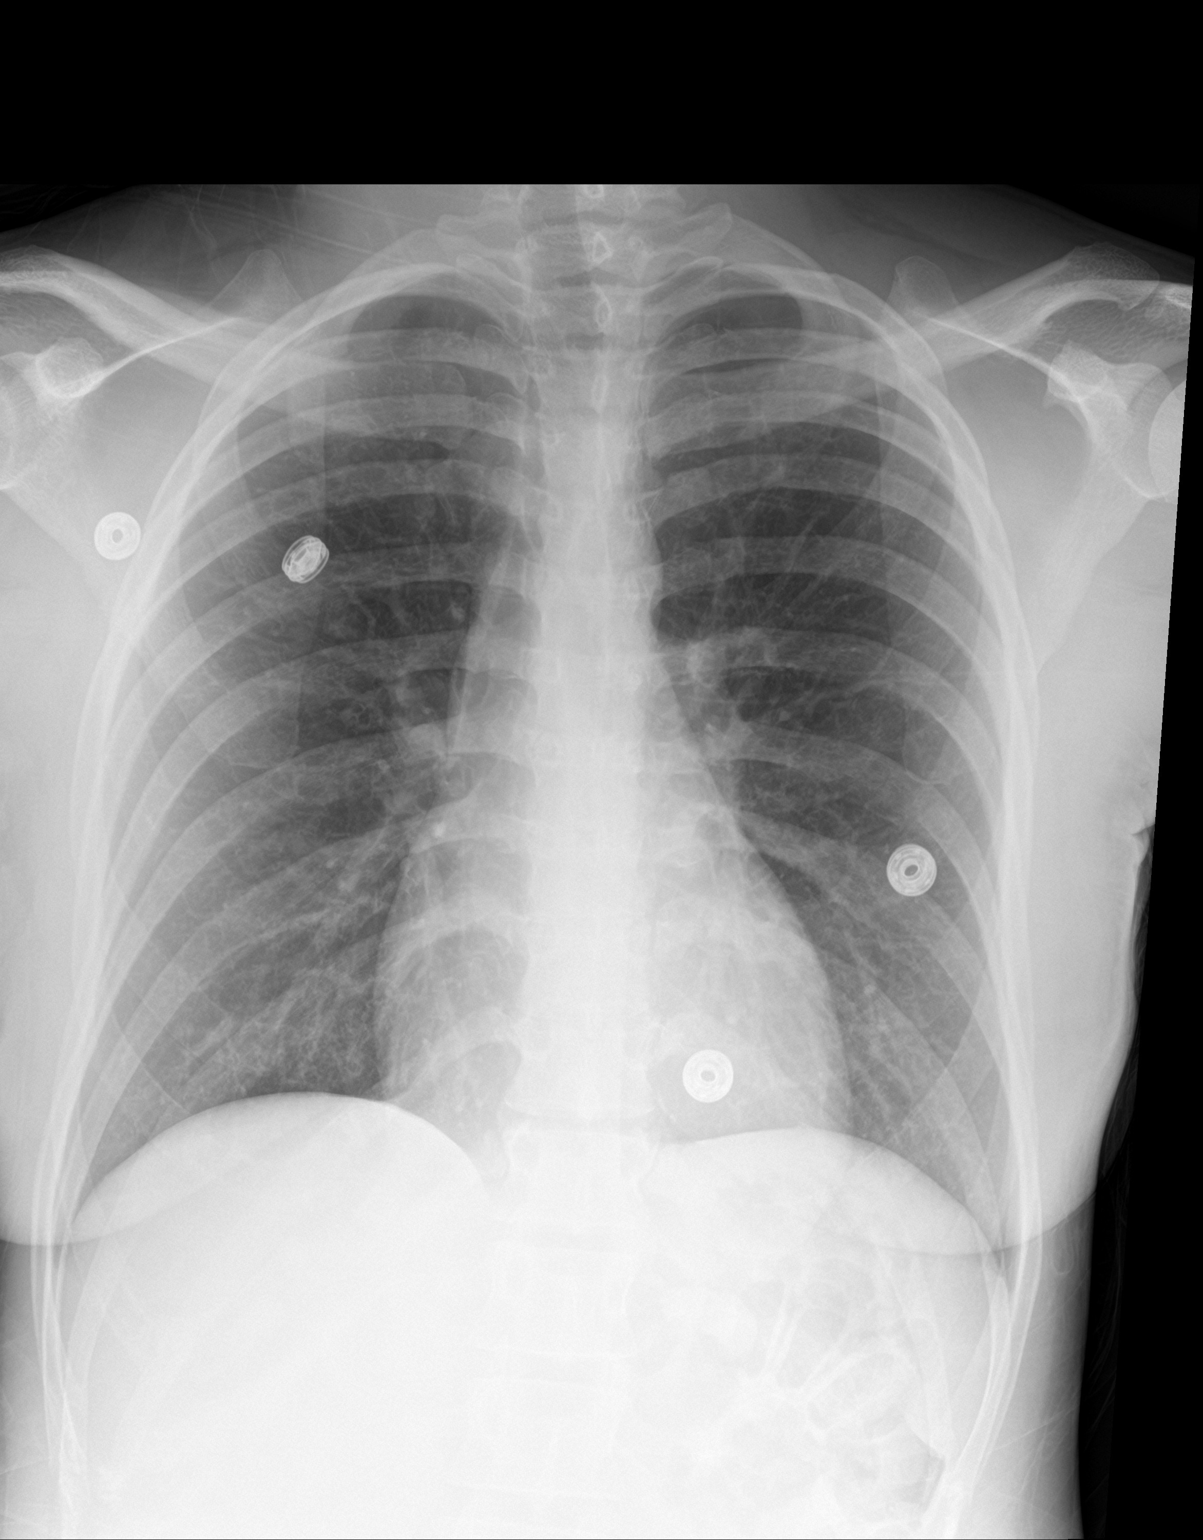

[abdomen erect]
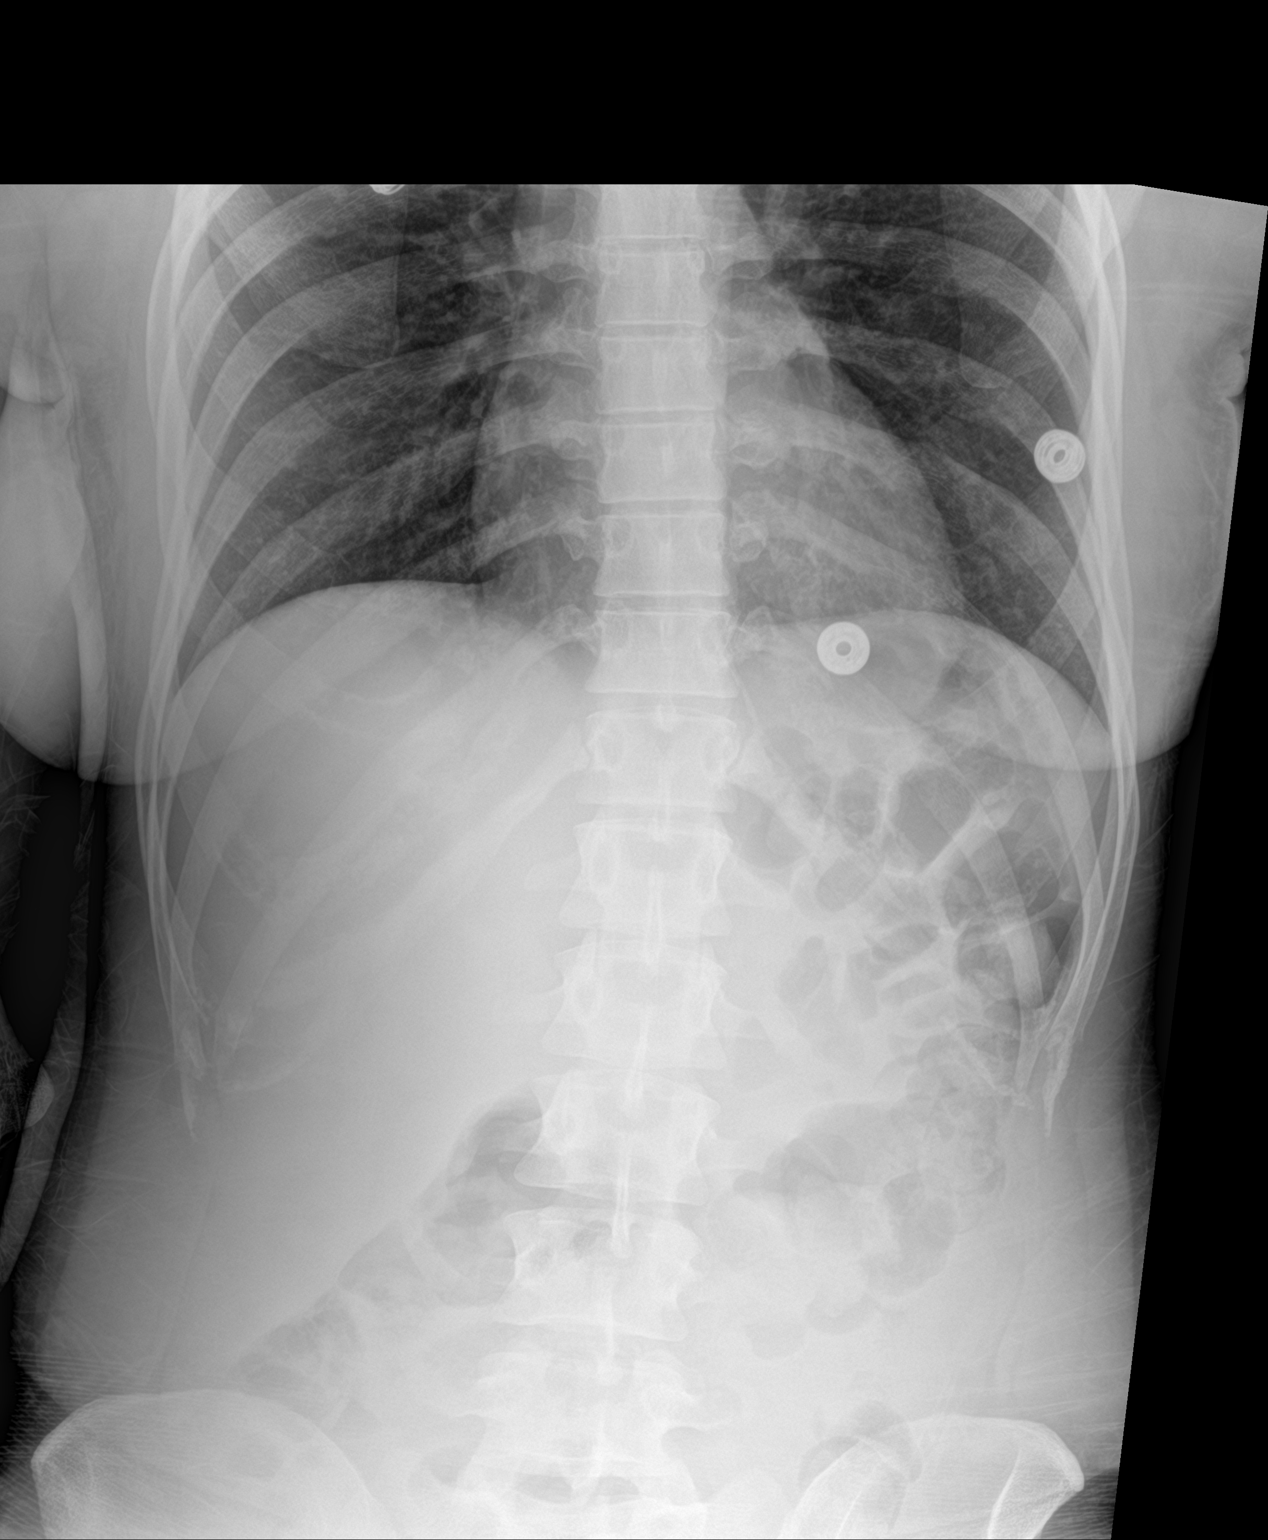

[abdomen supine]
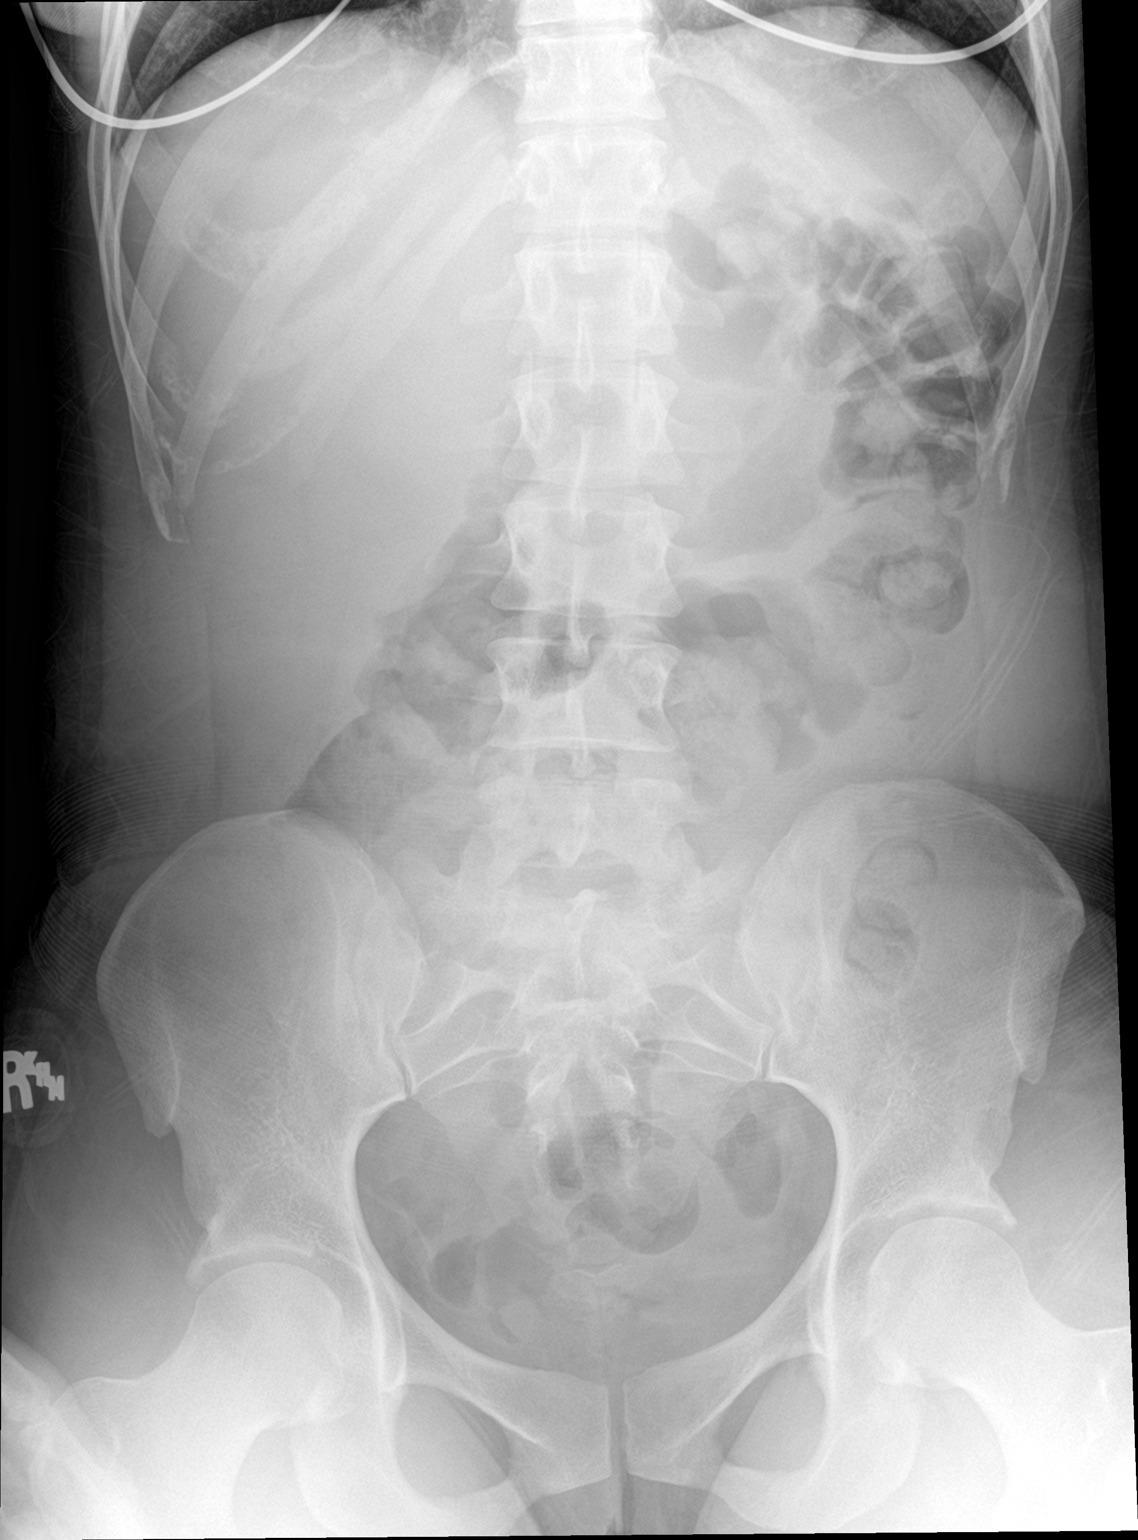

[3 of 3 positions shown; findings below may reference images not displayed]

FINDINGS: The chest is normal in appearance.  No evidence of pneumonia.

No free air, portal venous gas, or pneumatosis. The bowel gas
pattern is nonobstructive. No acute abnormalities identified.
IMPRESSION: Negative abdominal radiographs.  No acute cardiopulmonary disease.

## 2017-05-19 ENCOUNTER — Emergency Department (HOSPITAL_COMMUNITY)
Admission: EM | Admit: 2017-05-19 | Discharge: 2017-05-19 | Disposition: A | Discharge status: Home / Self Care | Payer: Self-pay | Attending: Emergency Medicine | Admitting: Emergency Medicine

## 2017-05-19 ENCOUNTER — Encounter (HOSPITAL_COMMUNITY): Payer: Self-pay | Admitting: Emergency Medicine

## 2017-05-19 DIAGNOSIS — Z5321 Procedure and treatment not carried out due to patient leaving prior to being seen by health care provider: Secondary | ICD-10-CM | POA: Insufficient documentation

## 2017-05-19 NOTE — ED Triage Notes (Signed)
Per EMS-states patient thinks she was bit on left breast by a spider-states no discharge-states she wants someone to take care of her pain now

## 2017-05-19 NOTE — ED Notes (Signed)
Called pt for room 2x, no response 

## 2017-07-10 ENCOUNTER — Encounter (HOSPITAL_COMMUNITY): Payer: Self-pay

## 2017-07-22 ENCOUNTER — Other Ambulatory Visit (HOSPITAL_COMMUNITY): Payer: Self-pay | Admitting: *Deleted

## 2017-07-22 DIAGNOSIS — N6320 Unspecified lump in the left breast, unspecified quadrant: Secondary | ICD-10-CM

## 2017-07-31 ENCOUNTER — Ambulatory Visit (HOSPITAL_COMMUNITY): Payer: Medicaid Other

## 2017-07-31 ENCOUNTER — Inpatient Hospital Stay: Admission: RE | Admit: 2017-07-31 | Payer: Medicaid Other | Source: Ambulatory Visit

## 2017-08-22 ENCOUNTER — Other Ambulatory Visit (HOSPITAL_COMMUNITY): Payer: Self-pay | Admitting: *Deleted

## 2017-08-22 DIAGNOSIS — N6320 Unspecified lump in the left breast, unspecified quadrant: Secondary | ICD-10-CM

## 2017-09-04 ENCOUNTER — Ambulatory Visit (HOSPITAL_COMMUNITY): Payer: Medicaid Other

## 2017-09-09 ENCOUNTER — Inpatient Hospital Stay: Admission: RE | Admit: 2017-09-09 | Payer: Medicaid Other | Source: Ambulatory Visit

## 2017-10-07 ENCOUNTER — Ambulatory Visit (HOSPITAL_COMMUNITY): Payer: Medicaid Other

## 2017-10-07 ENCOUNTER — Other Ambulatory Visit: Payer: Medicaid Other

## 2018-02-19 ENCOUNTER — Emergency Department (HOSPITAL_COMMUNITY)
Admission: EM | Admit: 2018-02-19 | Discharge: 2018-02-19 | Disposition: A | Discharge status: Home / Self Care | Payer: Medicaid Other | Attending: Emergency Medicine | Admitting: Emergency Medicine

## 2018-02-19 ENCOUNTER — Other Ambulatory Visit: Payer: Self-pay

## 2018-02-19 DIAGNOSIS — Y929 Unspecified place or not applicable: Secondary | ICD-10-CM | POA: Diagnosis not present

## 2018-02-19 DIAGNOSIS — W228XXA Striking against or struck by other objects, initial encounter: Secondary | ICD-10-CM | POA: Insufficient documentation

## 2018-02-19 DIAGNOSIS — Y9389 Activity, other specified: Secondary | ICD-10-CM | POA: Diagnosis not present

## 2018-02-19 DIAGNOSIS — S025XXA Fracture of tooth (traumatic), initial encounter for closed fracture: Secondary | ICD-10-CM

## 2018-02-19 DIAGNOSIS — Z79899 Other long term (current) drug therapy: Secondary | ICD-10-CM | POA: Diagnosis not present

## 2018-02-19 DIAGNOSIS — Y999 Unspecified external cause status: Secondary | ICD-10-CM | POA: Diagnosis not present

## 2018-02-19 DIAGNOSIS — S0993XA Unspecified injury of face, initial encounter: Secondary | ICD-10-CM | POA: Diagnosis present

## 2018-02-19 DIAGNOSIS — F17290 Nicotine dependence, other tobacco product, uncomplicated: Secondary | ICD-10-CM | POA: Diagnosis not present

## 2018-02-19 MED ORDER — OXYCODONE-ACETAMINOPHEN 5-325 MG PO TABS
1.0000 | ORAL_TABLET | Freq: Once | ORAL | Status: AC
  Administered 2018-02-19: 1 via ORAL
  Filled 2018-02-19: qty 1

## 2018-02-19 MED ORDER — PENICILLIN V POTASSIUM 500 MG PO TABS: 500.0000 mg | ORAL_TABLET | Freq: Four times a day (QID) | ORAL | 0 refills | Status: AC

## 2018-02-19 MED ORDER — ACETAMINOPHEN-CODEINE #3 300-30 MG PO TABS: 1.0000 | ORAL_TABLET | Freq: Four times a day (QID) | ORAL | 0 refills | Status: DC | PRN

## 2018-02-19 NOTE — ED Triage Notes (Signed)
Patient states that her tooth broke off last night and she awoke this a.m., with throbbing pain. Has been taking Advil with no relief.

## 2018-02-19 NOTE — ED Provider Notes (Signed)
Patient placed in Quick Look pathway, seen and evaluated   Chief Complaint: Toothache  HPI:   Right upper molar, onset last night, advil with no relief, no difficulty swallowing  ROS: toothache (one)  Physical Exam:   Gen: No distress  Neuro: Awake and Alert  Skin: Warm    Focused Exam: Decayed r upper molar   Initiation of care has begun. The patient has been counseled on the process, plan, and necessity for staying for the completion/evaluation, and the remainder of the medical screening examination    Denise Clark, Denise Verret, PA-C 02/19/18 [place] [id number]    Knapp, Jon, MD 02/22/18 2022

## 2018-02-19 NOTE — Discharge Instructions (Signed)
Please use the attached paperwork to find a dentist who can further manage your tooth pain.  Take antibiotics as prescribed.  I have also written you a prescription for pain medicine with codeine in it.

## 2018-02-19 NOTE — ED Provider Notes (Signed)
[facility] EMERGENCY DEPARTMENT Provider Note   CSN: [id number] Arrival date & time: 02/19/18  4098     History   Chief Complaint Chief Complaint  Patient presents with  . Dental Pain    HPI Denise Clark is a 27 y.o. female.  HPI   Denise Clark is a 27yo female with no significant past medical history who presents to the emergency department for right upper molar fracture.  Patient reports that she was eating steak yesterday when she suddenly heard a crack and chipped her right upper molar.  She reports that she has had constant pain ever since.  Pain is 10/10 in severity and described "as if someone is repeatedly punching me in the face."  The pain is worsened with chewing or with cold air.  She tried taking some ibuprofen last night without significant improvement in pain.  She denies fevers, chills, trouble breathing or swallowing, neck pain, facial swelling, trismus.  She does not have a dentist in the area.   No past medical history on file.  There are no active problems to display for this patient.   Past Surgical History:  Procedure Laterality Date  . CESAREAN SECTION    . ovarian pregnancy       OB History    Gravida  2   Para      Term      Preterm      AB  1   Living  1     SAB      TAB      Ectopic  1   Multiple      Live Births               Home Medications    Prior to Admission medications   Medication Sig Start Date End Date Taking? Authorizing Provider  cephALEXin (KEFLEX) 500 MG capsule Take 1 capsule (500 mg total) by mouth 4 (four) times daily. 11/03/16   Samuel Jester, DO  clindamycin (CLEOCIN) 300 MG capsule Take 1 capsule (300 mg total) by mouth 4 (four) times daily. 01/06/17   Triplett, Tammy, PA-C  etonogestrel (IMPLANON) 68 MG IMPL implant 1 each by Subdermal route once.    [provider]  HYDROcodone-acetaminophen (NORCO/VICODIN) 5-325 MG tablet Take 1 tablet by mouth every 4 (four)  hours as needed. Patient not taking: Reported on 11/03/2016 10/09/16   Burgess Amor, PA-C  ibuprofen (ADVIL,MOTRIN) 800 MG tablet Take 1 tablet (800 mg total) by mouth 3 (three) times daily. 01/06/17   Triplett, Tammy, PA-C  ondansetron (ZOFRAN ODT) 4 MG disintegrating tablet Take 1 tablet (4 mg total) by mouth every 8 (eight) hours as needed for nausea or vomiting. 11/03/16   Samuel Jester, DO  traMADol (ULTRAM) 50 MG tablet Take 1 tablet (50 mg total) by mouth every 6 (six) hours as needed. Patient not taking: Reported on 11/03/2016 09/13/16   Bethann Berkshire, MD    Family History No family history on file.  Social History Social History   Tobacco Use  . Smoking status: Current Some Day Smoker    Packs/day: 0.05    Types: Cigars, Cigarettes  . Smokeless tobacco: Never Used  Substance Use Topics  . Alcohol use: No  . Drug use: No     Allergies   Patient has no known allergies.   Review of Systems Review of Systems  Constitutional: Negative for fever.  HENT: Positive for dental problem. Negative for facial swelling and trouble swallowing.  Respiratory: Negative for shortness of breath.   Musculoskeletal: Negative for neck pain.     Physical Exam Updated Vital Signs BP 131/78 (BP Location: Right Arm)   Pulse 92   Temp 98.3 F (36.8 C) (Oral)   Resp 16   Ht 5\' 4"  (1.626 m)   Wt 69.4 kg (153 lb)   SpO2 100%   BMI 26.26 kg/m   Physical Exam  Constitutional: She appears well-developed and well-nourished. No distress.  HENT:  Head: Normocephalic and atraumatic.  Right upper molar with chipped tooth which is tender to the touch. No abscess or gingival erythema noted. Midline uvula. No trismus. OP moist and clear. No oropharyngeal erythema or edema. Neck supple with no tenderness. No facial edema.  Eyes: Pupils are equal, round, and reactive to light. Conjunctivae are normal. Right eye exhibits no discharge. Left eye exhibits no discharge.  Neck: Normal range of motion. Neck  supple.  Pulmonary/Chest: Effort normal. No respiratory distress.  Neurological: She is alert. Coordination normal.  Skin: She is not diaphoretic.  Psychiatric: She has a normal mood and affect. Her behavior is normal.  Nursing note and vitals reviewed.    ED Treatments / Results  Labs (all labs ordered are listed, but only abnormal results are displayed) Labs Reviewed - No data to display  EKG None  Radiology No results found.  Procedures Procedures (including critical care time)  Medications Ordered in ED Medications  oxyCODONE-acetaminophen (PERCOCET/ROXICET) 5-325 MG per tablet 1 tablet (has no administration in time range)     Initial Impression / Assessment and Plan / ED Course  I have reviewed the triage vital signs and the nursing notes.  Pertinent labs & imaging results that were available during my care of the patient were reviewed by me and considered in my medical decision making (see chart for details).     Patient presents with right upper molar dental fracture.  Calcium hydroxide paste applied in the ED. No gross abscess.  Exam unconcerning for Ludwig's angina or spread of infection.  Will treat with penicillin and anti-inflammatories and short course of tylenol #3.  Urged patient to follow-up with dentist and have provided her with dental resource guide in the emergency department.  Counseled her on reasons to return to the ED and she agrees and voiced understanding to the above plan.  Final Clinical Impressions(s) / ED Diagnoses   Final diagnoses:  Closed fracture of tooth, initial encounter    ED Discharge Orders    None       Lawrence MarseillesShrosbree, Makalyn Lennox J, PA-C 02/19/18 2122    Tilden Fossaees, Elizabeth, MD 02/20/18 [id number]

## 2018-03-23 ENCOUNTER — Ambulatory Visit (HOSPITAL_COMMUNITY)
Admission: EM | Admit: 2018-03-23 | Discharge: 2018-03-23 | Disposition: A | Discharge status: Home / Self Care | Payer: Medicaid Other | Attending: Family Medicine | Admitting: Family Medicine

## 2018-03-23 ENCOUNTER — Encounter (HOSPITAL_COMMUNITY): Payer: Self-pay | Admitting: Emergency Medicine

## 2018-03-23 DIAGNOSIS — Z3202 Encounter for pregnancy test, result negative: Secondary | ICD-10-CM

## 2018-03-23 DIAGNOSIS — R110 Nausea: Secondary | ICD-10-CM | POA: Diagnosis not present

## 2018-03-23 DIAGNOSIS — R109 Unspecified abdominal pain: Secondary | ICD-10-CM | POA: Diagnosis present

## 2018-03-23 DIAGNOSIS — F17210 Nicotine dependence, cigarettes, uncomplicated: Secondary | ICD-10-CM | POA: Diagnosis not present

## 2018-03-23 LAB — POCT URINALYSIS DIP (DEVICE)
Bilirubin Urine: NEGATIVE
Glucose, UA: NEGATIVE mg/dL
Hgb urine dipstick: NEGATIVE
Ketones, ur: NEGATIVE mg/dL
Leukocytes, UA: NEGATIVE
Nitrite: POSITIVE — AB
PH: 6.5 (ref 5.0–8.0)
PROTEIN: NEGATIVE mg/dL
SPECIFIC GRAVITY, URINE: 1.015 (ref 1.005–1.030)
UROBILINOGEN UA: 0.2 mg/dL (ref 0.0–1.0)

## 2018-03-23 LAB — COMPREHENSIVE METABOLIC PANEL
ALK PHOS: 50 U/L (ref 38–126)
ALT: 21 U/L (ref 0–44)
AST: 26 U/L (ref 15–41)
Albumin: 3.8 g/dL (ref 3.5–5.0)
Anion gap: 8 (ref 5–15)
CALCIUM: 9.1 mg/dL (ref 8.9–10.3)
CO2: 26 mmol/L (ref 22–32)
CREATININE: 0.75 mg/dL (ref 0.44–1.00)
Chloride: 105 mmol/L (ref 98–111)
GFR calc Af Amer: >60 mL/min (ref >60)
GFR calc non Af Amer: >60 mL/min (ref >60)
GLUCOSE: 102 mg/dL — AB (ref 70–99)
Potassium: 4.1 mmol/L (ref 3.5–5.1)
Sodium: 139 mmol/L (ref 135–145)
Total Bilirubin: 0.4 mg/dL (ref 0.3–1.2)
Total Protein: 7.2 g/dL (ref 6.5–8.1)

## 2018-03-23 LAB — LIPASE, BLOOD: Lipase: 25 U/L (ref 11–51)

## 2018-03-23 LAB — POCT PREGNANCY, URINE: Preg Test, Ur: NEGATIVE

## 2018-03-23 LAB — CBC
HEMATOCRIT: 35.1 % — AB (ref 36.0–46.0)
HEMOGLOBIN: 11 g/dL — AB (ref 12.0–15.0)
MCH: 29.7 pg (ref 26.0–34.0)
MCHC: 31.3 g/dL (ref 30.0–36.0)
MCV: 94.9 fL (ref 78.0–100.0)
Platelets: 273 10*3/uL (ref 150–400)
RBC: 3.7 MIL/uL — ABNORMAL LOW (ref 3.87–5.11)
RDW: 13.8 % (ref 11.5–15.5)
WBC: 6.7 10*3/uL (ref 4.0–10.5)

## 2018-03-23 MED ORDER — ONDANSETRON HCL 4 MG PO TABS: 4.0000 mg | ORAL_TABLET | Freq: Three times a day (TID) | ORAL | 0 refills | Status: DC | PRN

## 2018-03-23 MED ORDER — ONDANSETRON 4 MG PO TBDP
ORAL_TABLET | ORAL | Status: AC
  Filled 2018-03-23: qty 1

## 2018-03-23 MED ORDER — ONDANSETRON 4 MG PO TBDP
4.0000 mg | ORAL_TABLET | Freq: Once | ORAL | Status: AC
  Administered 2018-03-23: 4 mg via ORAL

## 2018-03-23 MED ORDER — CEPHALEXIN 500 MG PO CAPS: 500.0000 mg | ORAL_CAPSULE | Freq: Two times a day (BID) | ORAL | 0 refills | Status: AC

## 2018-03-23 NOTE — Discharge Instructions (Signed)
I am concerned about possible kidney infection so have started antibiotics.  We are culturing your urine and will notify you if any changes need to be made to treatment.  Drink plenty of water to empty bladder regularly. Avoid alcohol and caffeine as these may irritate the bladder.   Zofran as needed for nausea.  If develop worsening of pain, fevers, vomiting

## 2018-03-23 NOTE — ED Triage Notes (Signed)
PT reports right sided flank pain with radiation to abdomen. PT has nausea. Increased urination, no burning.

## 2018-03-23 NOTE — ED Provider Notes (Signed)
[facility]    CSN: [id number] Arrival date & time: 03/23/18  1757     History   Chief Complaint Chief Complaint  Patient presents with  . Back Pain  . Nausea    HPI Denise Clark is a 27 y.o. female.   Denise Clark presents with complaints of nausea and right flank pain which started 3-4 days ago. Back pain radiates to abdomen. No fevers. Denies diarrhea or constipation. No vomiting. Has still been eating and drinking. Denies urinary or vaginal symptoms. Tried taking an OTC back pain relief which did not help. Movement worsens the pain. Walking up stairs increases pain. No known injury. Denies any previous similar. She has an implanon. Without contributing medical history.     ROS per HPI.      History reviewed. No pertinent past medical history.  There are no active problems to display for this patient.   Past Surgical History:  Procedure Laterality Date  . CESAREAN SECTION    . ovarian pregnancy      OB History    Gravida  2   Para      Term      Preterm      AB  1   Living  1     SAB      TAB      Ectopic  1   Multiple      Live Births               Home Medications    Prior to Admission medications   Medication Sig Start Date End Date Taking? Authorizing Provider  acetaminophen-codeine (TYLENOL #3) 300-30 MG tablet Take 1-2 tablets by mouth every 6 (six) hours as needed for moderate pain. 02/19/18   Kellie ShropshireShrosbree, Emily J, PA-C  cephALEXin (KEFLEX) 500 MG capsule Take 1 capsule (500 mg total) by mouth 2 (two) times daily for 10 days. 03/23/18 04/02/18  Georgetta HaberBurky, Raney Antwine B, NP  clindamycin (CLEOCIN) 300 MG capsule Take 1 capsule (300 mg total) by mouth 4 (four) times daily. 01/06/17   Triplett, Tammy, PA-C  etonogestrel (IMPLANON) 68 MG IMPL implant 1 each by Subdermal route once.    [provider]  HYDROcodone-acetaminophen (NORCO/VICODIN) 5-325 MG tablet Take 1 tablet by mouth every 4 (four) hours as needed. Patient not  taking: Reported on 11/03/2016 10/09/16   Burgess AmorIdol, Julie, PA-C  ibuprofen (ADVIL,MOTRIN) 800 MG tablet Take 1 tablet (800 mg total) by mouth 3 (three) times daily. 01/06/17   Triplett, Tammy, PA-C  ondansetron (ZOFRAN ODT) 4 MG disintegrating tablet Take 1 tablet (4 mg total) by mouth every 8 (eight) hours as needed for nausea or vomiting. 11/03/16   Samuel JesterMcManus, Kathleen, DO  ondansetron (ZOFRAN) 4 MG tablet Take 1 tablet (4 mg total) by mouth every 8 (eight) hours as needed for nausea or vomiting. 03/23/18   Georgetta HaberBurky, Hurshell Dino B, NP  traMADol (ULTRAM) 50 MG tablet Take 1 tablet (50 mg total) by mouth every 6 (six) hours as needed. Patient not taking: Reported on 11/03/2016 09/13/16   Bethann BerkshireZammit, Joseph, MD    Family History No family history on file.  Social History Social History   Tobacco Use  . Smoking status: Current Some Day Smoker    Packs/day: 0.05    Types: Cigars, Cigarettes  . Smokeless tobacco: Never Used  Substance Use Topics  . Alcohol use: No  . Drug use: No     Allergies   Patient has no known allergies.   Review of  Systems Review of Systems   Physical Exam Triage Vital Signs ED Triage Vitals  Enc Vitals Group     BP 03/23/18 1809 132/62     Pulse Rate 03/23/18 1809 90     Resp 03/23/18 1809 16     Temp 03/23/18 1809 98.4 F (36.9 C)     Temp Source 03/23/18 1809 Oral     SpO2 03/23/18 1809 100 %     Weight --      Height --      Head Circumference --      Peak Flow --      Pain Score 03/23/18 1812 8     Pain Loc --      Pain Edu? --      Excl. in GC? --    No data found.  Updated Vital Signs BP 132/62 (BP Location: Left Arm)   Pulse 90   Temp 98.4 F (36.9 C) (Oral)   Resp 16   LMP 02/18/2018   SpO2 100%    Physical Exam  Constitutional: She is oriented to person, place, and time. She appears well-developed and well-nourished. No distress.  Cardiovascular: Normal rate, regular rhythm and normal heart sounds.  Pulmonary/Chest: Effort normal and breath sounds  normal.  Abdominal: Soft. There is tenderness in the right lower quadrant, suprapubic area and left lower quadrant. There is guarding and CVA tenderness.  RLQ > LLQ pain with guarding; right CVA tenderness although patient is quite resistant to thorough evaluation; pain with slight hop from step down on exam table, pain primarily to right flank/back   Neurological: She is alert and oriented to person, place, and time.  Skin: Skin is warm and dry.     UC Treatments / Results  Labs (all labs ordered are listed, but only abnormal results are displayed) Labs Reviewed  CBC - Abnormal; Notable for the following components:      Result Value   RBC 3.70 (*)    Hemoglobin 11.0 (*)    HCT 35.1 (*)    All other components within normal limits  COMPREHENSIVE METABOLIC PANEL - Abnormal; Notable for the following components:   Glucose, Bld 102 (*)    BUN <5 (*)    All other components within normal limits  POCT URINALYSIS DIP (DEVICE) - Abnormal; Notable for the following components:   Nitrite POSITIVE (*)    All other components within normal limits  URINE CULTURE  LIPASE, BLOOD  POCT PREGNANCY, URINE    EKG None  Radiology No results found.  Procedures Procedures (including critical care time)  Medications Ordered in UC Medications  ondansetron (ZOFRAN-ODT) disintegrating tablet 4 mg (4 mg Oral Given 03/23/18 1852)    Initial Impression / Assessment and Plan / UC Course  I have reviewed the triage vital signs and the nursing notes.  Pertinent labs & imaging results that were available during my care of the patient were reviewed by me and considered in my medical decision making (see chart for details).     Nitrite to urine. No leuks or hgb. Culture obtained. Negative lipase, CBC and CMP at this time, reassuring. Course of keflex initiated. Return precautions provided. If symptoms worsen or do not improve in the next week to return to be seen or to follow up with PCP.  Patient  verbalized understanding and agreeable to plan.    Final Clinical Impressions(s) / UC Diagnoses   Final diagnoses:  Right flank pain     Discharge Instructions  I am concerned about possible kidney infection so have started antibiotics.  We are culturing your urine and will notify you if any changes need to be made to treatment.  Drink plenty of water to empty bladder regularly. Avoid alcohol and caffeine as these may irritate the bladder.   Zofran as needed for nausea.  If develop worsening of pain, fevers, vomiting    ED Prescriptions    Medication Sig Dispense Auth. Provider   cephALEXin (KEFLEX) 500 MG capsule Take 1 capsule (500 mg total) by mouth 2 (two) times daily for 10 days. 20 capsule Chazlyn Cude B, NP   ondansetron (ZOFRAN) 4 MG tablet Take 1 tablet (4 mg total) by mouth every 8 (eight) hours as needed for nausea or vomiting. 10 tablet Georgetta Haber, NP     Controlled Substance Prescriptions [place] Controlled Substance Registry consulted? Not Applicable   Georgetta Haber, NP 03/23/18 2049

## 2018-03-25 LAB — URINE CULTURE

## 2018-09-26 ENCOUNTER — Emergency Department (HOSPITAL_COMMUNITY)
Admission: EM | Admit: 2018-09-26 | Discharge: 2018-09-26 | Disposition: A | Discharge status: Home / Self Care | Payer: Medicaid Other | Attending: Emergency Medicine | Admitting: Emergency Medicine

## 2018-09-26 ENCOUNTER — Other Ambulatory Visit: Payer: Self-pay

## 2018-09-26 ENCOUNTER — Encounter (HOSPITAL_COMMUNITY): Payer: Self-pay | Admitting: Emergency Medicine

## 2018-09-26 DIAGNOSIS — K029 Dental caries, unspecified: Secondary | ICD-10-CM | POA: Diagnosis not present

## 2018-09-26 DIAGNOSIS — K0889 Other specified disorders of teeth and supporting structures: Secondary | ICD-10-CM

## 2018-09-26 DIAGNOSIS — F17210 Nicotine dependence, cigarettes, uncomplicated: Secondary | ICD-10-CM | POA: Insufficient documentation

## 2018-09-26 DIAGNOSIS — Z79899 Other long term (current) drug therapy: Secondary | ICD-10-CM | POA: Diagnosis not present

## 2018-09-26 HISTORY — DX: Unspecified ectopic pregnancy without intrauterine pregnancy: O00.90

## 2018-09-26 MED ORDER — IBUPROFEN 600 MG PO TABS: 600.0000 mg | ORAL_TABLET | Freq: Four times a day (QID) | ORAL | 0 refills | Status: DC

## 2018-09-26 MED ORDER — ACETAMINOPHEN 500 MG PO TABS
1000.0000 mg | ORAL_TABLET | Freq: Once | ORAL | Status: AC
  Administered 2018-09-26: 1000 mg via ORAL
  Filled 2018-09-26: qty 2

## 2018-09-26 MED ORDER — IBUPROFEN 800 MG PO TABS
800.0000 mg | ORAL_TABLET | Freq: Once | ORAL | Status: AC
  Administered 2018-09-26: 800 mg via ORAL
  Filled 2018-09-26: qty 1

## 2018-09-26 MED ORDER — AMOXICILLIN 250 MG PO CAPS
500.0000 mg | ORAL_CAPSULE | Freq: Once | ORAL | Status: AC
  Administered 2018-09-26: 500 mg via ORAL
  Filled 2018-09-26: qty 2

## 2018-09-26 MED ORDER — AMOXICILLIN 500 MG PO CAPS: 500.0000 mg | ORAL_CAPSULE | Freq: Three times a day (TID) | ORAL | 0 refills | Status: DC

## 2018-09-26 MED ORDER — ONDANSETRON HCL 4 MG PO TABS
4.0000 mg | ORAL_TABLET | Freq: Once | ORAL | Status: AC
  Administered 2018-09-26: 4 mg via ORAL
  Filled 2018-09-26: qty 1

## 2018-09-26 NOTE — ED Triage Notes (Signed)
Patient c/o right upper dental pain that started last night, broken tooth noted. Denies any fevers. Denies taking anything for pain.

## 2018-09-26 NOTE — ED Provider Notes (Signed)
[facility] EMERGENCY DEPARTMENT Provider Note   CSN: [id number] Arrival date & time: 09/26/18  1023     History   Chief Complaint Chief Complaint  Patient presents with  . Dental Pain    HPI Denise Clark is a 28 y.o. female.  Patient is a 28 year old female who presents to the emergency department with a complaint of dental pain.  The patient states that she has a cavity that ultimately resulted in a broken tooth of the right upper jaw area.  She is having pain that is moving from the jaw area into the face.  No high fever reported.  No bleeding from the cavity site.  No difficulty with swallowing.  Minimal difficulty with opening and closing the mouth.  She has not taken any medication for the pain.  She presents now for assistance with this issue.  The history is provided by the patient.    Past Medical History:  Diagnosis Date  . Ectopic pregnancy     There are no active problems to display for this patient.   Past Surgical History:  Procedure Laterality Date  . CESAREAN SECTION    . ECTOPIC PREGNANCY SURGERY    . ovarian pregnancy       OB History    Gravida  2   Para      Term      Preterm      AB  1   Living  1     SAB      TAB      Ectopic  1   Multiple      Live Births               Home Medications    Prior to Admission medications   Medication Sig Start Date End Date Taking? Authorizing Provider  acetaminophen-codeine (TYLENOL #3) 300-30 MG tablet Take 1-2 tablets by mouth every 6 (six) hours as needed for moderate pain. 02/19/18   Kellie ShropshireShrosbree, Emily J, PA-C  clindamycin (CLEOCIN) 300 MG capsule Take 1 capsule (300 mg total) by mouth 4 (four) times daily. 01/06/17   Triplett, Tammy, PA-C  etonogestrel (IMPLANON) 68 MG IMPL implant 1 each by Subdermal route once.    [provider]  HYDROcodone-acetaminophen (NORCO/VICODIN) 5-325 MG tablet Take 1 tablet by mouth every 4 (four) hours as needed. Patient not taking:  Reported on 11/03/2016 10/09/16   Burgess AmorIdol, Julie, PA-C  ibuprofen (ADVIL,MOTRIN) 800 MG tablet Take 1 tablet (800 mg total) by mouth 3 (three) times daily. 01/06/17   Triplett, Tammy, PA-C  ondansetron (ZOFRAN ODT) 4 MG disintegrating tablet Take 1 tablet (4 mg total) by mouth every 8 (eight) hours as needed for nausea or vomiting. 11/03/16   Samuel JesterMcManus, Kathleen, DO  ondansetron (ZOFRAN) 4 MG tablet Take 1 tablet (4 mg total) by mouth every 8 (eight) hours as needed for nausea or vomiting. 03/23/18   Georgetta HaberBurky, Natalie B, NP  traMADol (ULTRAM) 50 MG tablet Take 1 tablet (50 mg total) by mouth every 6 (six) hours as needed. Patient not taking: Reported on 11/03/2016 09/13/16   Bethann BerkshireZammit, Joseph, MD    Family History History reviewed. No pertinent family history.  Social History Social History   Tobacco Use  . Smoking status: Current Some Day Smoker    Packs/day: 0.05    Types: Cigars, Cigarettes  . Smokeless tobacco: Never Used  Substance Use Topics  . Alcohol use: No  . Drug use: No     Allergies  Patient has no known allergies.   Review of Systems Review of Systems  Constitutional: Negative for activity change.       All ROS Neg except as noted in HPI  HENT: Positive for dental problem. Negative for nosebleeds.   Eyes: Negative for photophobia and discharge.  Respiratory: Negative for cough, shortness of breath and wheezing.   Cardiovascular: Negative for chest pain and palpitations.  Gastrointestinal: Negative for abdominal pain and blood in stool.  Genitourinary: Negative for dysuria, frequency and hematuria.  Musculoskeletal: Negative for arthralgias, back pain and neck pain.  Skin: Negative.   Neurological: Negative for dizziness, seizures and speech difficulty.  Psychiatric/Behavioral: Negative for confusion and hallucinations.     Physical Exam Updated Vital Signs BP 129/70 (BP Location: Right Arm)   Pulse 89   Temp 98.4 F (36.9 C) (Oral)   Resp 16   Ht 5\' 4"  (1.626 m)   Wt  72.1 kg   LMP 09/02/2018   SpO2 100%   BMI 27.29 kg/m   Physical Exam Vitals signs and nursing note reviewed.  Constitutional:      Appearance: She is well-developed. She is not toxic-appearing.  HENT:     Head: Normocephalic.     Comments: Patient has a dental carry of the right upper molar area.  The tooth is decayed and broken to the gumline.  There is swelling of the gum, but no visible abscess.  The oropharynx is clear.  The uvula is in the midline.  There is no swelling under the tongue.    Right Ear: Tympanic membrane and external ear normal.     Left Ear: Tympanic membrane and external ear normal.  Eyes:     General: Lids are normal.     Pupils: Pupils are equal, round, and reactive to light.  Neck:     Musculoskeletal: Normal range of motion and neck supple.     Vascular: No carotid bruit.  Cardiovascular:     Rate and Rhythm: Normal rate and regular rhythm.     Pulses: Normal pulses.     Heart sounds: Normal heart sounds.  Pulmonary:     Effort: No respiratory distress.     Breath sounds: Normal breath sounds.  Abdominal:     General: Bowel sounds are normal.     Palpations: Abdomen is soft.     Tenderness: There is no abdominal tenderness. There is no guarding.  Musculoskeletal: Normal range of motion.  Lymphadenopathy:     Head:     Right side of head: No submandibular adenopathy.     Left side of head: No submandibular adenopathy.     Cervical: No cervical adenopathy.  Skin:    General: Skin is warm and dry.  Neurological:     Mental Status: She is alert and oriented to person, place, and time.     Cranial Nerves: No cranial nerve deficit.     Sensory: No sensory deficit.  Psychiatric:        Speech: Speech normal.      ED Treatments / Results  Labs (all labs ordered are listed, but only abnormal results are displayed) Labs Reviewed - No data to display  EKG None  Radiology No results found.  Procedures Procedures (including critical care  time)  Medications Ordered in ED Medications - No data to display   Initial Impression / Assessment and Plan / ED Course  I have reviewed the triage vital signs and the nursing notes.  Pertinent labs & imaging  results that were available during my care of the patient were reviewed by me and considered in my medical decision making (see chart for details).       Final Clinical Impressions(s) / ED Diagnoses MDM  Vital signs reviewed.  Pulse oximetry is 100% on room air.  Within normal limits by my interpretation.  Patient has a dental carry decayed and broken to the gumline.  There is no evidence for Ludewig's angina or other emergent changes at this time.  The patient will be encouraged to use 600 mg of ibuprofen and 1000 mg of Tylenol with each meal and at bedtime, or every 6 hours.  Patient will be given a prescription for Amoxil to use.  We will also encourage patient to use some of the over-the-counter remedies such as Orajel, and or temporary filling.  Dental resources given to the patient to use.   Final diagnoses:  Dental caries  Pain, dental    ED Discharge Orders         Ordered    amoxicillin (AMOXIL) 500 MG capsule  3 times daily     09/26/18 1136    ibuprofen (ADVIL,MOTRIN) 600 MG tablet  4 times daily     09/26/18 1136           Ivery QualeBryant, Kyanna Mahrt, PA-C 09/28/18 2218    Doug SouJacubowitz, Sam, MD 09/29/18 1053

## 2018-09-26 NOTE — Discharge Instructions (Addendum)
Your tooth is decayed/broken at the gumline.  There is increased redness of the gum.  There is no visible abscess noted at this time.  Please use the Amoxil 3 times daily with food.  Please use 600 mg of ibuprofen and 1000 mg of Tylenol with each meal and at bedtime, or every 6 hours.  You may also find some assistance with your discomfort by using Orajel to the pain site.  It may also help to cover the area so that the broken areas are not exposed to air with a substance called artificial filling.  Please see a dentist as soon as possible.  Dental resources are given in your discharge instructions.

## 2019-07-12 ENCOUNTER — Emergency Department (HOSPITAL_COMMUNITY)
Admission: EM | Admit: 2019-07-12 | Discharge: 2019-07-12 | Discharge status: Against Medical Advice | Payer: Medicaid Other

## 2019-07-12 NOTE — ED Notes (Addendum)
Pt stated that she could not wait to be seen because she had to get back home to her kids. Pt decided to LWBS before triage.

## 2019-07-13 ENCOUNTER — Other Ambulatory Visit: Payer: Self-pay

## 2019-07-13 DIAGNOSIS — Z20822 Contact with and (suspected) exposure to covid-19: Secondary | ICD-10-CM

## 2019-07-14 ENCOUNTER — Emergency Department (HOSPITAL_COMMUNITY)
Admission: EM | Admit: 2019-07-14 | Discharge: 2019-07-16 | Disposition: A | Discharge status: Other Acute Inpatient Hospital | Payer: Medicaid Other | Source: Home / Self Care | Attending: Emergency Medicine | Admitting: Emergency Medicine

## 2019-07-14 ENCOUNTER — Other Ambulatory Visit: Payer: Self-pay

## 2019-07-14 ENCOUNTER — Encounter (HOSPITAL_COMMUNITY): Payer: Self-pay | Admitting: Emergency Medicine

## 2019-07-14 ENCOUNTER — Emergency Department (HOSPITAL_COMMUNITY)
Admission: EM | Admit: 2019-07-14 | Discharge: 2019-07-14 | Disposition: A | Discharge status: Home / Self Care | Payer: Medicaid Other | Attending: Emergency Medicine | Admitting: Emergency Medicine

## 2019-07-14 ENCOUNTER — Telehealth: Payer: Self-pay | Admitting: *Deleted

## 2019-07-14 DIAGNOSIS — Z5321 Procedure and treatment not carried out due to patient leaving prior to being seen by health care provider: Secondary | ICD-10-CM | POA: Diagnosis not present

## 2019-07-14 DIAGNOSIS — Z20828 Contact with and (suspected) exposure to other viral communicable diseases: Secondary | ICD-10-CM | POA: Insufficient documentation

## 2019-07-14 DIAGNOSIS — T7621XA Adult sexual abuse, suspected, initial encounter: Secondary | ICD-10-CM | POA: Diagnosis present

## 2019-07-14 DIAGNOSIS — F23 Brief psychotic disorder: Secondary | ICD-10-CM

## 2019-07-14 LAB — ACETAMINOPHEN LEVEL: Acetaminophen (Tylenol), Serum: <10 ug/mL — ABNORMAL LOW (ref 10–30)

## 2019-07-14 LAB — URINALYSIS, ROUTINE W REFLEX MICROSCOPIC
Bilirubin Urine: NEGATIVE
Glucose, UA: NEGATIVE mg/dL
Ketones, ur: 20 mg/dL — AB
Leukocytes,Ua: NEGATIVE
Nitrite: POSITIVE — AB
Protein, ur: NEGATIVE mg/dL
Specific Gravity, Urine: 1.011 (ref 1.005–1.030)
pH: 7 (ref 5.0–8.0)

## 2019-07-14 LAB — BASIC METABOLIC PANEL
Anion gap: 11 (ref 5–15)
BUN: 7 mg/dL (ref 6–20)
CO2: 25 mmol/L (ref 22–32)
Calcium: 9.7 mg/dL (ref 8.9–10.3)
Chloride: 101 mmol/L (ref 98–111)
Creatinine, Ser: 0.66 mg/dL (ref 0.44–1.00)
GFR calc Af Amer: >60 mL/min (ref >60)
GFR calc non Af Amer: >60 mL/min (ref >60)
Glucose, Bld: 107 mg/dL — ABNORMAL HIGH (ref 70–99)
Potassium: 3.4 mmol/L — ABNORMAL LOW (ref 3.5–5.1)
Sodium: 137 mmol/L (ref 135–145)

## 2019-07-14 LAB — NOVEL CORONAVIRUS, NAA: SARS-CoV-2, NAA: NOT DETECTED

## 2019-07-14 LAB — CBC WITH DIFFERENTIAL/PLATELET
Abs Immature Granulocytes: 0.02 10*3/uL (ref 0.00–0.07)
Basophils Absolute: 0 10*3/uL (ref 0.0–0.1)
Basophils Relative: 0 %
Eosinophils Absolute: 0 10*3/uL (ref 0.0–0.5)
Eosinophils Relative: 0 %
HCT: 38.8 % (ref 36.0–46.0)
Hemoglobin: 12.6 g/dL (ref 12.0–15.0)
Immature Granulocytes: 0 %
Lymphocytes Relative: 27 %
Lymphs Abs: 2.6 10*3/uL (ref 0.7–4.0)
MCH: 31 pg (ref 26.0–34.0)
MCHC: 32.5 g/dL (ref 30.0–36.0)
MCV: 95.6 fL (ref 80.0–100.0)
Monocytes Absolute: 0.8 10*3/uL (ref 0.1–1.0)
Monocytes Relative: 8 %
Neutro Abs: 6.1 10*3/uL (ref 1.7–7.7)
Neutrophils Relative %: 65 %
Platelets: 299 10*3/uL (ref 150–400)
RBC: 4.06 MIL/uL (ref 3.87–5.11)
RDW: 14 % (ref 11.5–15.5)
WBC: 9.5 10*3/uL (ref 4.0–10.5)
nRBC: 0 % (ref 0.0–0.2)

## 2019-07-14 LAB — SARS CORONAVIRUS 2 BY RT PCR (HOSPITAL ORDER, PERFORMED IN ~~LOC~~ HOSPITAL LAB): SARS Coronavirus 2: NEGATIVE

## 2019-07-14 LAB — SALICYLATE LEVEL: Salicylate Lvl: <7 mg/dL (ref 2.8–30.0)

## 2019-07-14 LAB — ETHANOL: Alcohol, Ethyl (B): <10 mg/dL (ref <10)

## 2019-07-14 LAB — RAPID URINE DRUG SCREEN, HOSP PERFORMED
Amphetamines: NOT DETECTED
Barbiturates: NOT DETECTED
Benzodiazepines: NOT DETECTED
Cocaine: NOT DETECTED
Opiates: NOT DETECTED
Tetrahydrocannabinol: POSITIVE — AB

## 2019-07-14 LAB — PREGNANCY, URINE: Preg Test, Ur: NEGATIVE

## 2019-07-14 MED ORDER — ZIPRASIDONE MESYLATE 20 MG IM SOLR
20.0000 mg | Freq: Once | INTRAMUSCULAR | Status: AC
  Administered 2019-07-15: 12:00:00 20 mg via INTRAMUSCULAR
  Filled 2019-07-14: qty 20

## 2019-07-14 MED ORDER — ACETAMINOPHEN 325 MG PO TABS
650.0000 mg | ORAL_TABLET | Freq: Once | ORAL | Status: AC
  Administered 2019-07-14: 20:00:00 650 mg via ORAL
  Filled 2019-07-14: qty 2

## 2019-07-14 MED ORDER — ZIPRASIDONE MESYLATE 20 MG IM SOLR
20.0000 mg | Freq: Once | INTRAMUSCULAR | Status: AC
  Administered 2019-07-14: 13:00:00 20 mg via INTRAMUSCULAR
  Filled 2019-07-14: qty 20

## 2019-07-14 MED ORDER — NITROFURANTOIN MACROCRYSTAL 100 MG PO CAPS
100.0000 mg | ORAL_CAPSULE | Freq: Two times a day (BID) | ORAL | Status: DC
  Administered 2019-07-15 (×2): 100 mg via ORAL
  Filled 2019-07-14: qty 1
  Filled 2019-07-14: qty 2
  Filled 2019-07-14 (×10): qty 1

## 2019-07-14 MED ORDER — STERILE WATER FOR INJECTION IJ SOLN
INTRAMUSCULAR | Status: AC
  Administered 2019-07-14: 13:00:00 2 mL
  Filled 2019-07-14: qty 10

## 2019-07-14 NOTE — ED Notes (Signed)
Spoke with [facility] and informed that pt was awake at this time and requested TTS be attempted again.
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
## 2019-07-14 NOTE — ED Notes (Addendum)
Pt attempts to leave she is forced back into her room. The patient is breathing fine. MD aware of what is happening with the patient and what medications he has had.

## 2019-07-14 NOTE — ED Notes (Signed)
Patient is no longer calm, she is asking for her son and to go home. Notified patient she is unable to leave at this this time and to please have  A seat on her bed

## 2019-07-14 NOTE — ED Notes (Signed)
Called for room x2 with no response. 

## 2019-07-14 NOTE — ED Notes (Signed)
Patient is very anxious. She is swaying back and forth and asking to leave, notified MD

## 2019-07-14 NOTE — ED Notes (Signed)
Patient calm and resting.

## 2019-07-14 NOTE — ED Notes (Signed)
IVC papers completed and faxed to the [facility].  Called them to confirm.
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
## 2019-07-14 NOTE — Telephone Encounter (Signed)
Pt called in requesting her COVID-19 test result.    I let her know it had not been processed yet    I let her know we would call her once the result was in. She then hung up.

## 2019-07-14 NOTE — ED Triage Notes (Signed)
Pt brought in emergency commitment by [employer]. Police were called because she was walking on the side of the street. When [employer] arrived, pt stated "I feel like I am going crazy." Pt also left 30 yo child at home by himself. Police noted erratic behavior. Pt tore all clothes in her closet and stated, "there are wires in here." Pt and son had checked in ED last night, but LWBS. Pt was tested for COVID and is awaiting results. DSS took custody of child.
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
## 2019-07-14 NOTE — ED Notes (Signed)
Pt requesting to leave stating "I'm old enough to make my own decision. I'm fine I am way better than I was earlier today." Pt pulling over covers stating I'M leaving." Pt explained that she was IVC'd and that she had to be cleared by [facility] and IVC papers rescinded before she would be D/C or admitted. Pt argumentative and wanting to speak with the Dr. Dr. Rogene Houston notified and speaking with pt at this time.

## 2019-07-14 NOTE — ED Provider Notes (Signed)
Patient concerned she might have a UTI.  Urinalysis does show positive nitrite but no significant amount of bacteria or WBCs.  Will treat with nitrofurantoin.   Delora Fuel, MD 70/26/37 [phone]

## 2019-07-15 MED ORDER — ACETAMINOPHEN 325 MG PO TABS
650.0000 mg | ORAL_TABLET | Freq: Once | ORAL | Status: DC
  Filled 2019-07-15: qty 2

## 2019-07-15 MED ORDER — OLANZAPINE 5 MG PO TABS
5.0000 mg | ORAL_TABLET | Freq: Every day | ORAL | Status: DC
  Administered 2019-07-16: 5 mg via ORAL
  Filled 2019-07-15: qty 1

## 2019-07-15 MED ORDER — STERILE WATER FOR INJECTION IJ SOLN
INTRAMUSCULAR | Status: AC
  Filled 2019-07-15: qty 10

## 2019-07-15 NOTE — ED Notes (Signed)
Pt starting to yell out and get agitated. Pt states she is wanting to leave.

## 2019-07-15 NOTE — ED Notes (Signed)
Board in room updated per pt request. Pt insisting the date this NT put is wrong, pt assured the date is correct. Pt does not believe that at this time. Pt states we are telling her the same things every day that are incorrect.

## 2019-07-15 NOTE — ED Notes (Signed)
TTS in progress 
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
## 2019-07-15 NOTE — ED Notes (Signed)
Patient denies pain and is resting comfortably.  
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
## 2019-07-15 NOTE — ED Notes (Addendum)
Pt asked RN for Tylenol for sore throat. When this RN went to give it, pt became tearful and stated, "I shouldn't take any more pills because it is what got me in here." When questioned further, pt stated she was scared she had Covid , so she took a bunch of medicine to prevent it. Pt states she took Theraflu, Benadryl, and either Advil or Tylenol PM. Pt states then she tried to smoke a blunt to calm herself down. Pt appears alert and oriented at this time.

## 2019-07-15 NOTE — ED Notes (Signed)
Pt given breakfast at this time. 

## 2019-07-15 NOTE — ED Notes (Signed)
Patient is starting to get agitated. She is signing she wants her stuff and to go home right now! She is getting very loud. Notified nurse

## 2019-07-15 NOTE — ED Notes (Signed)
Patient showered and I changed her sheets. She is calm walking around her room watching tv

## 2019-07-15 NOTE — ED Notes (Signed)
Pt changed into paper scrubs (blue/purple mix)- pt shirt, pants, socks, flip flops, placed in [facility] locker room

## 2019-07-15 NOTE — ED Notes (Signed)
Denise Clark with DSS on phone with pt at this time.

## 2019-07-15 NOTE — ED Notes (Addendum)
Pt still agitated and confused. Pt stating she wants to get out of here, denies knowing that DSS has her son and states she doesn't know where her son is. Pt talking to herself continuously. Security and [employer] remain at bedside.

## 2019-07-15 NOTE — ED Notes (Signed)
Water given to pt 

## 2019-07-15 NOTE — ED Notes (Signed)
Pt cooperative, but pacing in the room , states she is just cleaning up. Asked for a bath cloth so she can was up(pt had shower earlier today), given bath cloth and new scrubs. Pt asked often if she is going home today.

## 2019-07-16 ENCOUNTER — Other Ambulatory Visit: Payer: Self-pay

## 2019-07-16 ENCOUNTER — Encounter (HOSPITAL_COMMUNITY): Payer: Self-pay | Admitting: *Deleted

## 2019-07-16 ENCOUNTER — Inpatient Hospital Stay (HOSPITAL_COMMUNITY)
Admission: AD | Admit: 2019-07-16 | Discharge: 2019-07-20 | DRG: 885 | Disposition: A | Discharge status: Home / Self Care | Payer: Medicaid Other | Attending: Psychiatry | Admitting: Psychiatry

## 2019-07-16 DIAGNOSIS — F17210 Nicotine dependence, cigarettes, uncomplicated: Secondary | ICD-10-CM | POA: Diagnosis present

## 2019-07-16 DIAGNOSIS — G4700 Insomnia, unspecified: Secondary | ICD-10-CM | POA: Diagnosis present

## 2019-07-16 DIAGNOSIS — Z7289 Other problems related to lifestyle: Secondary | ICD-10-CM

## 2019-07-16 DIAGNOSIS — F29 Unspecified psychosis not due to a substance or known physiological condition: Secondary | ICD-10-CM | POA: Diagnosis present

## 2019-07-16 DIAGNOSIS — F312 Bipolar disorder, current episode manic severe with psychotic features: Secondary | ICD-10-CM | POA: Diagnosis not present

## 2019-07-16 DIAGNOSIS — F419 Anxiety disorder, unspecified: Secondary | ICD-10-CM | POA: Diagnosis present

## 2019-07-16 DIAGNOSIS — N390 Urinary tract infection, site not specified: Secondary | ICD-10-CM | POA: Diagnosis present

## 2019-07-16 DIAGNOSIS — F319 Bipolar disorder, unspecified: Secondary | ICD-10-CM | POA: Diagnosis present

## 2019-07-16 DIAGNOSIS — F3110 Bipolar disorder, current episode manic without psychotic features, unspecified: Secondary | ICD-10-CM | POA: Diagnosis not present

## 2019-07-16 DIAGNOSIS — Z5321 Procedure and treatment not carried out due to patient leaving prior to being seen by health care provider: Secondary | ICD-10-CM | POA: Diagnosis not present

## 2019-07-16 DIAGNOSIS — F200 Paranoid schizophrenia: Secondary | ICD-10-CM | POA: Diagnosis not present

## 2019-07-16 HISTORY — DX: Schizophrenia, unspecified: F20.9

## 2019-07-16 HISTORY — DX: Bipolar disorder, unspecified: F31.9

## 2019-07-16 MED ORDER — OLANZAPINE 5 MG PO TABS
5.0000 mg | ORAL_TABLET | Freq: Once | ORAL | Status: DC
  Filled 2019-07-16: qty 1

## 2019-07-16 MED ORDER — ZIPRASIDONE MESYLATE 20 MG IM SOLR: 20.0000 mg | Freq: Two times a day (BID) | INTRAMUSCULAR | Status: DC | PRN

## 2019-07-16 MED ORDER — HYDROXYZINE HCL 25 MG PO TABS
25.0000 mg | ORAL_TABLET | Freq: Three times a day (TID) | ORAL | Status: DC | PRN
  Administered 2019-07-16 – 2019-07-17 (×2): 25 mg via ORAL
  Filled 2019-07-16 (×2): qty 1

## 2019-07-16 MED ORDER — MAGNESIUM HYDROXIDE 400 MG/5ML PO SUSP: 30.0000 mL | Freq: Every day | ORAL | Status: DC | PRN

## 2019-07-16 MED ORDER — TRAZODONE HCL 100 MG PO TABS
100.0000 mg | ORAL_TABLET | Freq: Every evening | ORAL | Status: DC | PRN
  Administered 2019-07-16: 100 mg via ORAL
  Filled 2019-07-16: qty 1

## 2019-07-16 MED ORDER — ACETAMINOPHEN 325 MG PO TABS
650.0000 mg | ORAL_TABLET | Freq: Four times a day (QID) | ORAL | Status: DC | PRN
  Administered 2019-07-17 – 2019-07-20 (×5): 650 mg via ORAL
  Filled 2019-07-16 (×5): qty 2

## 2019-07-16 MED ORDER — LORAZEPAM 1 MG PO TABS: 1.0000 mg | ORAL_TABLET | ORAL | Status: DC | PRN

## 2019-07-16 MED ORDER — ALUM & MAG HYDROXIDE-SIMETH 200-200-20 MG/5ML PO SUSP: 30.0000 mL | ORAL | Status: DC | PRN

## 2019-07-16 MED ORDER — NITROFURANTOIN MONOHYD MACRO 100 MG PO CAPS
100.0000 mg | ORAL_CAPSULE | Freq: Two times a day (BID) | ORAL | Status: DC
  Administered 2019-07-16: 10:00:00 100 mg via ORAL
  Filled 2019-07-16: qty 1

## 2019-07-16 MED ORDER — TEMAZEPAM 30 MG PO CAPS: 30.0000 mg | ORAL_CAPSULE | Freq: Once | ORAL | Status: DC

## 2019-07-16 NOTE — ED Notes (Signed)
Pt showered and given clean linen and scrubs.

## 2019-07-16 NOTE — Progress Notes (Signed)
Pt accepted to [facility]; 508-2  Dr. Dwyane Dee is the accepting provider.    Dr. Parke Poisson is the attending provider.    Call report to [phone]    Pt is involuntary and will be transported by law enforcement.   Pt is scheduled to arrive at [place], LCSW, [place] Disposition CSW [facility]/TTS [phone]

## 2019-07-16 NOTE — Tx Team (Signed)
Initial Treatment Plan 07/16/2019 5:26 PM Denise Clark YIF:[id number]    PATIENT STRESSORS: Financial difficulties Substance abuse   PATIENT STRENGTHS: Average or above average intelligence Communication skills General fund of knowledge Physical Health   PATIENT IDENTIFIED PROBLEMS: Drug induced psychosis  financial                   DISCHARGE CRITERIA:  Ability to meet basic life and health needs Adequate post-discharge living arrangements Improved stabilization in mood, thinking, and/or behavior Medical problems require only outpatient monitoring Motivation to continue treatment in a less acute level of care Need for constant or close observation no longer present Reduction of life-threatening or endangering symptoms to within safe limits Safe-care adequate arrangements made Verbal commitment to aftercare and medication compliance  PRELIMINARY DISCHARGE PLAN: Outpatient therapy Return to previous living arrangement Return to previous work or school arrangements  PATIENT/FAMILY INVOLVEMENT: This treatment plan has been presented to and reviewed with the patient, Denise Clark, and/or family member, .  The patient and family have been given the opportunity to ask questions and make suggestions.  Mosie Lukes, RN 07/16/2019, 5:26 PM

## 2019-07-16 NOTE — Progress Notes (Signed)
Adult Psychoeducational Group Note  Date:  07/16/2019 Time:  10:14 PM  Group Topic/Focus:  Wrap-Up Group:   The focus of this group is to help patients review their daily goal of treatment and discuss progress on daily workbooks.  Participation Level:  Minimal  Participation Quality:  Appropriate  Affect:  Flat  Cognitive:  Oriented  Insight: Appropriate  Engagement in Group:  Engaged  Modes of Intervention:  Education and Support  Additional Comments:  Patient attended and participated in group tonight. She reports that she wants to get herself right and go home.  Salley Scarlet [facility] 07/16/2019, 10:14 PM

## 2019-07-16 NOTE — [facility] Counselor (Signed)
Reassessment:  TTS completed reassessment with patient. Pt was in very pleasant but anxious mood. Pt was in scrubs and walking around in room. Pt denied current SI, HI, AVH.  Pt expressed she is anxious to leave hospital and transfer to [facility] and just waiting. Pt stated she is also ready to get back to her kids.

## 2019-07-16 NOTE — ED Notes (Signed)
Pt given mask per request. Pt then states the mask is what is making her sick.

## 2019-07-16 NOTE — ED Notes (Signed)
Personal belongings sent with [employer].

## 2019-07-16 NOTE — ED Notes (Signed)
Pt ambulated to restroom x3 in the last hour.

## 2019-07-16 NOTE — ED Notes (Signed)
Pt continuously going through paper towels and asking this NT to throw them away.

## 2019-07-16 NOTE — ED Notes (Signed)
Pt asking this NT if she can go to "the other place now". Pt made aware that she will not be going anywhere right now. Pt is brushing teeth and gagging. Pt states she is going to start getting her water from the sink because the water we are giving her is "making her sick" and tastes different.

## 2019-07-16 NOTE — ED Notes (Signed)
Macrodantin not in pyxis. Will give when pharmacy brings down pt daily medication.

## 2019-07-16 NOTE — ED Notes (Signed)
Pt pacing in room. Pt given new lid and straw for drink per request. Pt asking for board in room to be updated again. Pt told the board will be updated at shift change.

## 2019-07-16 NOTE — ED Notes (Signed)
Updated pt on plan to transfer to [facility] later today. Pt agreeable to plan and remains calm and cooperative at this time.

## 2019-07-16 NOTE — Progress Notes (Signed)
   07/16/19 2055  Psych Admission Type (Psych Patients Only)  Admission Status Involuntary  Psychosocial Assessment  Patient Complaints Self-harm thoughts;Suspiciousness  Eye Contact Fair  Facial Expression Anxious  Affect Anxious;Sad  Speech Logical/coherent  Interaction Assertive  Motor Activity Other (Comment) (WDL)  Appearance/Hygiene Unremarkable  Behavior Characteristics Cooperative;Appropriate to situation;Anxious  Mood Depressed;Anxious;Preoccupied  Thought Process  Coherency WDL  Content Blaming self  Delusions None reported or observed  Perception WDL  Hallucination None reported or observed  Judgment Poor  Confusion None  Danger to Self  Current suicidal ideation? Denies  Danger to Others  Danger to Others None reported or observed

## 2019-07-16 NOTE — Progress Notes (Signed)
Pt admitted to [facility] involuntary after being picked up by police with bizarre behaviors. Pt reports taking Theraflu and Benadryl and smoking marijuana. Pt remembers seeing her 30 year old son and her father with her, thinking the TV was talking to her and cameras in the walls. Pt reports a hx of bipolar and schizophrenia. 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 Pt is alert and oriented on admission to [facility]. She currently denies si and hi. She reports no hallucinations at this time and does not appear to be responding to internal stimuli.

## 2019-07-17 DIAGNOSIS — F29 Unspecified psychosis not due to a substance or known physiological condition: Secondary | ICD-10-CM

## 2019-07-17 LAB — LIPID PANEL
Cholesterol: 166 mg/dL (ref 0–200)
HDL: 33 mg/dL — ABNORMAL LOW (ref >40)
LDL Cholesterol: 117 mg/dL — ABNORMAL HIGH (ref 0–99)
Total CHOL/HDL Ratio: 5 RATIO
Triglycerides: 79 mg/dL (ref <150)
VLDL: 16 mg/dL (ref 0–40)

## 2019-07-17 LAB — HEMOGLOBIN A1C
Hgb A1c MFr Bld: 5.2 % (ref 4.8–5.6)
Mean Plasma Glucose: 102.54 mg/dL

## 2019-07-17 LAB — TSH: TSH: 1.48 u[IU]/mL (ref 0.350–4.500)

## 2019-07-17 MED ORDER — OLANZAPINE 2.5 MG PO TABS
2.5000 mg | ORAL_TABLET | ORAL | Status: DC
  Administered 2019-07-18 – 2019-07-20 (×3): 2.5 mg via ORAL
  Filled 2019-07-17 (×4): qty 1

## 2019-07-17 MED ORDER — OLANZAPINE 5 MG PO TABS
5.0000 mg | ORAL_TABLET | Freq: Every day | ORAL | Status: DC
  Administered 2019-07-17: 5 mg via ORAL
  Filled 2019-07-17 (×3): qty 1

## 2019-07-17 MED ORDER — ZIPRASIDONE MESYLATE 20 MG IM SOLR: 10.0000 mg | Freq: Two times a day (BID) | INTRAMUSCULAR | Status: DC | PRN

## 2019-07-17 MED ORDER — LORAZEPAM 1 MG PO TABS
1.0000 mg | ORAL_TABLET | ORAL | Status: DC | PRN
  Filled 2019-07-17 (×3): qty 1

## 2019-07-17 MED ORDER — LORAZEPAM 1 MG PO TABS
1.0000 mg | ORAL_TABLET | Freq: Three times a day (TID) | ORAL | Status: DC | PRN
  Administered 2019-07-17 – 2019-07-20 (×3): 1 mg via ORAL

## 2019-07-17 MED ORDER — NITROFURANTOIN MONOHYD MACRO 100 MG PO CAPS
100.0000 mg | ORAL_CAPSULE | Freq: Two times a day (BID) | ORAL | Status: DC
  Administered 2019-07-17 – 2019-07-20 (×7): 100 mg via ORAL
  Filled 2019-07-17 (×10): qty 1

## 2019-07-17 NOTE — BHH Group Notes (Signed)
Adult Psychoeducational Group Note  Date:  07/17/2019 Time:  11:14 PM  Group Topic/Focus:  Wrap-Up Group:   The focus of this group is to help patients review their daily goal of treatment and discuss progress on daily workbooks.  Participation Level:  Active  Participation Quality:  Intrusive and Inattentive  Affect:  Excited  Cognitive:  Alert and Appropriate  Insight: Appropriate  Engagement in Group:  Distracting  Modes of Intervention:  Discussion and Education  Additional Comments:  Pt attended and participated in wrap up group this evening and rated their day a 9/10. Pt goal was to go to group and to work on a discharge plan, in which they plan to leave tomorrow.   Cristi Loron 07/17/2019, 11:14 PM

## 2019-07-17 NOTE — Progress Notes (Signed)
   07/17/19 2115  Psych Admission Type (Psych Patients Only)  Admission Status Involuntary  Psychosocial Assessment  Patient Complaints Anxiety;Suspiciousness;Worrying  Eye Contact Avoids  Facial Expression Anxious  Affect Anxious;Sad  Speech Logical/coherent  Interaction Assertive  Motor Activity Other (Comment) (WNL)  Appearance/Hygiene Unremarkable  Behavior Characteristics Anxious;Guarded  Mood Anxious;Suspicious;Preoccupied  Thought Process  Coherency WDL  Content Blaming self  Delusions None reported or observed  Perception WDL  Hallucination None reported or observed  Judgment Poor  Confusion None  Danger to Self  Current suicidal ideation? Denies  Danger to Others  Danger to Others None reported or observed
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
## 2019-07-17 NOTE — BHH Group Notes (Signed)
BHH Group Notes:  (Nursing/MHT/Case Management/Adjunct)  Date:  07/17/2019  Time: 900 am  Type of Therapy:  Nurse Education Healthy Coping skills Collages  Participation Level:  None  Participation Quality:  Resistant  Affect:  Irritable  Cognitive:  Alert and Disorganized  Insight:  Lacking  Engagement in Group:  Defensive  Modes of Intervention:  Activity  Summary of Progress/Problems:   Denise Clark L 07/17/2019, 9:55 AM 
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
## 2019-07-17 NOTE — Progress Notes (Signed)
Patient ID: Denise Clark, female   DOB: 01-16-1991, 28 y.o.   MRN: [id number]    [place] NOVEL CORONAVIRUS (COVID-19) DAILY CHECK-OFF SYMPTOMS - answer yes or no to each - every day NO YES  Have you had a fever in the past 24 hours?  . Fever (Temp > 37.80C / 100F) X   Have you had any of these symptoms in the past 24 hours? . New Cough .  Sore Throat  .  Shortness of Breath .  Difficulty Breathing .  Unexplained Body Aches   X   Have you had any one of these symptoms in the past 24 hours not related to allergies?   . Runny Nose .  Nasal Congestion .  Sneezing   X   If you have had runny nose, nasal congestion, sneezing in the past 24 hours, has it worsened?  X   EXPOSURES - check yes or no X   Have you traveled outside the state in the past 14 days?  X   Have you been in contact with someone with a confirmed diagnosis of COVID-19 or PUI in the past 14 days without wearing appropriate PPE?  X   Have you been living in the same home as a person with confirmed diagnosis of COVID-19 or a PUI (household contact)?    X   Have you been diagnosed with COVID-19?    X              What to do next: Answered NO to all: Answered YES to anything:   Proceed with unit schedule Follow the [facility] Inpatient Flowsheet.

## 2019-07-17 NOTE — [facility] Group Notes (Signed)
LCSW Group Therapy Note  Date/Time:  07/17/2019   11:00AM  Type of Therapy and Topic:  Group Therapy:  Fears and Unhealthy/Healthy Coping Skills  Participation Level:  Active   Description of Group:  The focus of this group was to discuss some of the prevalent fears that patients experience, and to identify the commonalities among group members.  A fun exercise was used to initiate the discussion, followed by writing on the white board a group-generated list of unhealthy coping and healthy coping techniques to deal with each fear.    Therapeutic Goals: 1. Patient will be able to distinguish between healthy and unhealthy coping skills 2. Patient will identify and describe 3 fears they experience 3. Patient will identify one positive coping strategy for each fear they experience 4. Patient will respond empathetically to peers' statements regarding fears they experience  Summary of Patient Progress:  The patient expressed herself freely and with pertinent responses in the early part of group.  Her affect was blunted and she was difficult to understand with the mask over her mouth, although other patients were not difficult to understand.  While participating fully, she suddenly got up and left the room when another patient came and got her.  They then walked in and out of the room together several times before finally staying outside.  Therapeutic Modalities Cognitive Behavioral Therapy Motivational Interviewing  Selmer Dominion, LCSW

## 2019-07-17 NOTE — Plan of Care (Signed)
  Problem: Coping: Goal: Will verbalize feelings Outcome: Progressing   Problem: Role Relationship: Goal: Ability to interact with others will improve Outcome: Progressing   Problem: Safety: Goal: Ability to remain free from injury will improve Outcome: Progressing

## 2019-07-18 DIAGNOSIS — F3110 Bipolar disorder, current episode manic without psychotic features, unspecified: Secondary | ICD-10-CM

## 2019-07-18 MED ORDER — OLANZAPINE 10 MG PO TABS
10.0000 mg | ORAL_TABLET | Freq: Every day | ORAL | Status: DC
  Administered 2019-07-18: 10 mg via ORAL
  Filled 2019-07-18 (×2): qty 1

## 2019-07-18 MED ORDER — NICOTINE POLACRILEX 2 MG MT GUM
2.0000 mg | CHEWING_GUM | OROMUCOSAL | Status: DC | PRN
  Filled 2019-07-18: qty 1

## 2019-07-18 NOTE — Progress Notes (Signed)
Patient reports that she needs to discharge because of work and needing to get her son from her uncle. Patients boyfriend visited on last night also.

## 2019-07-18 NOTE — Progress Notes (Signed)
DAR NOTE: Patient presents with irritable affect and mood.  Denies suicidal thoughts, auditory and visual hallucinations.  Described energy level as normal and concentration as good.  Rates depression at 0, hopelessness at 2, and anxiety at 0.  Maintained on routine safety checks.  Medications given as prescribed.  Support and encouragement offered as needed.  Attended group and participated.  States goal for today is "going home to be with my son."  Patient preoccupied with getting discharge.  Requested and received Ativan 1 mg for complain of anxiety and agitation with good effect.  Patient is safe on and off the unit.

## 2019-07-18 NOTE — [facility] Group Notes (Signed)
[place] LCSW Group Therapy Note  Date/Time:  07/18/2019  11:00AM-12:00PM  Type of Therapy and Topic:  Group Therapy:  Music and Mood  Participation Level:  Active   Description of Group: In this process group, members listened to a variety of genres of music and identified that different types of music evoke different responses.  Patients were encouraged to identify music that was soothing for them and music that was energizing for them.  Patients discussed how this knowledge can help with wellness and recovery in various ways including managing depression and anxiety as well as encouraging healthy sleep habits.    Therapeutic Goals: 1. Patients will explore the impact of different varieties of music on mood 2. Patients will verbalize the thoughts they have when listening to different types of music 3. Patients will identify music that is soothing to them as well as music that is energizing to them 4. Patients will discuss how to use this knowledge to assist in maintaining wellness and recovery 5. Patients will explore the use of music as a coping skill  Summary of Patient Progress:  At the beginning of group, patient expressed that she felt good but was ready to go home, is missing her 6yo son which she stated caused her anxiety to be a 9 out of 10.  She participated fully in group although she did leave for awhile in the middle of group.  At the end of group, patient expressed that her anxiety was completely gone.    Therapeutic Modalities: Solution Focused Brief Therapy Activity   Selmer Dominion, LCSW
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
## 2019-07-18 NOTE — Progress Notes (Signed)
[facility] Group Notes:  (Nursing/MHT/Case Management/Adjunct)  Date:  07/18/2019  Time:  1000  Type of Therapy:  Nurse Education  Participation Level:  Active  Participation Quality:  Appropriate  Affect:  Appropriate  Cognitive:  Disorganized  Insight:  Improving  Engagement in Group:  Engaged  Modes of Intervention:  Activity  Summary of Progress/Problems: Exercise and therapeutic ball activity    

## 2019-07-18 NOTE — Progress Notes (Signed)
   07/18/19 2115  Psych Admission Type (Psych Patients Only)  Admission Status Involuntary  Psychosocial Assessment  Patient Complaints None  Eye Contact Fair  Facial Expression Flat  Affect Anxious;Appropriate to circumstance  Speech Logical/coherent  Interaction Assertive  Motor Activity Other (Comment) (WNL)  Appearance/Hygiene Unremarkable  Behavior Characteristics Cooperative;Appropriate to situation  Mood Pleasant  Thought Process  Coherency WDL  Content Blaming self  Delusions None reported or observed  Perception WDL  Hallucination None reported or observed  Judgment Poor  Confusion None  Danger to Self  Current suicidal ideation? Denies  Danger to Others  Danger to Others None reported or observed
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
## 2019-07-19 DIAGNOSIS — F200 Paranoid schizophrenia: Secondary | ICD-10-CM

## 2019-07-19 MED ORDER — OLANZAPINE 7.5 MG PO TABS
15.0000 mg | ORAL_TABLET | Freq: Every day | ORAL | Status: DC
  Administered 2019-07-19: 15 mg via ORAL
  Filled 2019-07-19 (×2): qty 2

## 2019-07-19 MED ORDER — ARIPIPRAZOLE 2 MG PO TABS
2.0000 mg | ORAL_TABLET | Freq: Once | ORAL | Status: AC
  Administered 2019-07-19: 2 mg via ORAL
  Filled 2019-07-19 (×2): qty 1

## 2019-07-19 NOTE — BHH Counselor (Signed)
CSW met with patient to discuss a plan for discharge. Patient stated that she just talked to her case worker with DSS regarding her son. Patient asked if the CSW can call her: Ms Jimmye Norman.   CSW called the case worker who stated that the patient's son is in foster care and that whenever she is discharged that they will reschedule the meeting they need to have to work on a plan for the patient to get her son back. Case worker explained that she already explained this to the patient.  CSW discussed what the case worker told her with the patient. Patient stated that she can go stay with her boyfriend for housing but did ask for housing resources. Patient agreed to aftercare with [facility] for medication and therapy and asked about SSDI and help with paying old power bills.   CSW printed off housing resources, SSDI application information, and resources for paying old power bills and gave those resources to the patient who stated that she will call now.    Ardelle Anton, MSW, [facility] Phone: [phone] Fax: [phone]

## 2019-07-19 NOTE — Progress Notes (Signed)
Recreation Therapy Notes  Date: 11.16.20 Time: 1000 Location: 500 Hall Dayroom  Group Topic: Anxiety  Goal Area(s) Addresses:  Patient will identify triggers to anxiety. Patient will identify physical symptoms and thoughts when anxious. Patient will identify coping skills for dealing with anxiety.  Behavioral Response:  Minimal  Intervention: Worksheet, pencils  Activity: Introduction of Anxiety.  Patients were to identify three things that trigger them.  Patients were to also identify the physical symptoms they experience when anxious, the thoughts they experience when anxious and the coping skills they use to deal with anxiety.  Education: Communication, Discharge Planning  Education Outcome: Acknowledges understanding/In group clarification offered/Needs additional education.   Clinical Observations/Feedback: Pt was focused on trying to get discharged.  Pt did fill out sheet but did not share in group.  Pt identified triggers as dad, not getting to see son and being here in the hospital.  Pt has physical symptom of getting mad; thoughts to kill dad and copes by talking to self.    Victorino Sparrow, LRT/CTRS    Victorino Sparrow A 07/19/2019 11:34 AM

## 2019-07-19 NOTE — Progress Notes (Signed)
Recreation Therapy Notes  11.16.20 [phone]:  LRT attempted to complete recreation therapy assessment with pt.  Pt refused to complete assessment stating she just wants to sign papers to she can go home.  LRT explained to pt the doctor was the person who determines when she gets discharged.  Pt stated she wanted to speak with the doctor about discharge.  LRT will attempt to complete assessment at a later time.   Victorino Sparrow, LRT/CTRS    Victorino Sparrow A 07/19/2019 11:50 AM

## 2019-07-19 NOTE — Tx Team (Signed)
Interdisciplinary Treatment and Diagnostic Plan Update  07/19/2019 Time of Session: 10:35am Denise Clark MRN: [id number]  Principal Diagnosis: <principal problem not specified>  Secondary Diagnoses: Active Problems:   Psychosis (HCC)   Bipolar I disorder, most recent episode (or current) manic (HCC)   Current Medications:  Current Facility-Administered Medications  Medication Dose Route Frequency Provider Last Rate Last Dose  . acetaminophen (TYLENOL) tablet 650 mg  650 mg Oral Q6H PRN Maryagnes Amos, FNP   650 mg at 07/19/19 1126  . alum & mag hydroxide-simeth (MAALOX/MYLANTA) 200-200-20 MG/5ML suspension 30 mL  30 mL Oral Q4H PRN Rosario Adie, Juel Burrow, FNP      . LORazepam (ATIVAN) tablet 1 mg  1 mg Oral Q8H PRN Cobos, Rockey Situ, MD   1 mg at 07/18/19 1335  . ziprasidone (GEODON) injection 10 mg  10 mg Intramuscular Q12H PRN Cobos, Rockey Situ, MD       And  . LORazepam (ATIVAN) tablet 1 mg  1 mg Oral PRN Cobos, Rockey Situ, MD      . magnesium hydroxide (MILK OF MAGNESIA) suspension 30 mL  30 mL Oral Daily PRN Starkes-Perry, Juel Burrow, FNP      . nicotine polacrilex (NICORETTE) gum 2 mg  2 mg Oral PRN Cobos, Rockey Situ, MD      . nitrofurantoin (macrocrystal-monohydrate) (MACROBID) capsule 100 mg  100 mg Oral Q12H Aldean Baker, NP   100 mg at 07/19/19 0831  . OLANZapine (ZYPREXA) tablet 15 mg  15 mg Oral QHS Malvin Johns, MD      . OLANZapine [facility]) tablet 2.5 mg  2.5 mg Oral BH-q7a Cobos, Rockey Situ, MD   2.5 mg at 07/19/19 0831   PTA Medications: Medications Prior to Admission  Medication Sig Dispense Refill Last Dose  . etonogestrel (IMPLANON) 68 MG IMPL implant 1 each by Subdermal route once.       Patient Stressors: Financial difficulties Substance abuse  Patient Strengths: Average or above average intelligence Wellsite geologist fund of knowledge Physical Health  Treatment Modalities: Medication Management, Group therapy, Case management,   1 to 1 session with clinician, Psychoeducation, Recreational therapy.   Physician Treatment Plan for Primary Diagnosis: <principal problem not specified> Long Term Goal(s): Improvement in symptoms so as ready for discharge Improvement in symptoms so as ready for discharge   Short Term Goals: Ability to identify changes in lifestyle to reduce recurrence of condition will improve Ability to verbalize feelings will improve Ability to disclose and discuss suicidal ideas Ability to demonstrate self-control will improve Ability to identify and develop effective coping behaviors will improve  Medication Management: Evaluate patient's response, side effects, and tolerance of medication regimen.  Therapeutic Interventions: 1 to 1 sessions, Unit Group sessions and Medication administration.  Evaluation of Outcomes: Progressing  Physician Treatment Plan for Secondary Diagnosis: Active Problems:   Psychosis (HCC)   Bipolar I disorder, most recent episode (or current) manic (HCC)  Long Term Goal(s): Improvement in symptoms so as ready for discharge Improvement in symptoms so as ready for discharge   Short Term Goals: Ability to identify changes in lifestyle to reduce recurrence of condition will improve Ability to verbalize feelings will improve Ability to disclose and discuss suicidal ideas Ability to demonstrate self-control will improve Ability to identify and develop effective coping behaviors will improve     Medication Management: Evaluate patient's response, side effects, and tolerance of medication regimen.  Therapeutic Interventions: 1 to 1 sessions, Unit Group sessions and Medication administration.  Evaluation of Outcomes: Progressing   RN Treatment Plan for Primary Diagnosis: <principal problem not specified> Long Term Goal(s): Knowledge of disease and therapeutic regimen to maintain health will improve  Short Term Goals: Ability to participate in decision making will  improve, Ability to verbalize feelings will improve, Ability to disclose and discuss suicidal ideas, Ability to identify and develop effective coping behaviors will improve and Compliance with prescribed medications will improve  Medication Management: RN will administer medications as ordered by provider, will assess and evaluate patient's response and provide education to patient for prescribed medication. RN will report any adverse and/or side effects to prescribing provider.  Therapeutic Interventions: 1 on 1 counseling sessions, Psychoeducation, Medication administration, Evaluate responses to treatment, Monitor vital signs and CBGs as ordered, Perform/monitor CIWA, COWS, AIMS and Fall Risk screenings as ordered, Perform wound care treatments as ordered.  Evaluation of Outcomes: Progressing   LCSW Treatment Plan for Primary Diagnosis: <principal problem not specified> Long Term Goal(s): Safe transition to appropriate next level of care at discharge, Engage patient in therapeutic group addressing interpersonal concerns.  Short Term Goals: Engage patient in aftercare planning with referrals and resources and Increase skills for wellness and recovery  Therapeutic Interventions: Assess for all discharge needs, 1 to 1 time with Social worker, Explore available resources and support systems, Assess for adequacy in community support network, Educate family and significant other(s) on suicide prevention, Complete Psychosocial Assessment, Interpersonal group therapy.  Evaluation of Outcomes: Progressing   Progress in Treatment: Attending groups: Yes. Participating in groups: Yes. Taking medication as prescribed: Yes. Toleration medication: Yes. Family/Significant other contact made: No, will contact:  pt declined; with spe Patient understands diagnosis: Yes. Discussing patient identified problems/goals with staff: Yes. Medical problems stabilized or resolved: Yes. Denies suicidal/homicidal  ideation: Yes. Issues/concerns per patient self-inventory: No. Other:   New problem(s) identified: No, Describe:  None  New Short Term/Long Term Goal(s): Medication stabilization, elimination of SI thoughts, and development of a comprehensive mental wellness plan.   Patient Goals:  "get life back in order and get back with my son"  Discharge Plan or Barriers: CSW will continue to follow up for appropriate referrals and possible discharge planning  Reason for Continuation of Hospitalization: Aggression Delusions  Hallucinations Medication stabilization  Estimated Length of Stay: 1-2 days  Attendees: Patient: Denise Clark  07/19/2019   Physician: Dr. Johnn Hai, MD 07/19/2019   Nursing: Vladimir Faster, RN 07/19/2019   RN Care Manager: 07/19/2019   Social Worker: Ardelle Anton, LCSW  07/19/2019  Recreational Therapist:  07/19/2019   Other:  07/19/2019   Other:  07/19/2019   Other: 07/19/2019      Scribe for Treatment Team: Trecia Rogers, LCSW 07/19/2019 1:15 PM

## 2019-07-19 NOTE — [facility] Group Notes (Signed)
[facility] LCSW Group Therapy Note  Date/Time: 07/19/2019 @ 1:30pm  Type of Therapy and Topic:  Group Therapy:  Overcoming Obstacles  Participation Level:  [facility] PARTICIPATION LEVEL: Active  Description of Group:    In this group patients will be encouraged to explore what they see as obstacles to their own wellness and recovery. They will be guided to discuss their thoughts, feelings, and behaviors related to these obstacles. The group will process together ways to cope with barriers, with attention given to specific choices patients can make. Each patient will be challenged to identify changes they are motivated to make in order to overcome their obstacles. This group will be process-oriented, with patients participating in exploration of their own experiences as well as giving and receiving support and challenge from other group members.  Therapeutic Goals: 1. Patient will identify personal and current obstacles as they relate to admission. 2. Patient will identify barriers that currently interfere with their wellness or overcoming obstacles.  3. Patient will identify feelings, thought process and behaviors related to these barriers. 4. Patient will identify two changes they are willing to make to overcome these obstacles:    Summary of Patient Progress   Patient was active and engaged throughout group therapy today. 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 Patient was abel to identify two changes that she is willing to overcome her obstacle which is talking to jesus and God and talking to the social worker to come up with a plan to discharge.    Therapeutic Modalities:   Cognitive Behavioral Therapy Solution Focused  Therapy Motivational Interviewing Relapse Prevention Therapy   Ardelle Anton, LCSW
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
## 2019-07-20 DIAGNOSIS — F312 Bipolar disorder, current episode manic severe with psychotic features: Secondary | ICD-10-CM

## 2019-07-20 MED ORDER — ARIPIPRAZOLE ER 400 MG IM SRER
400.0000 mg | INTRAMUSCULAR | Status: DC
  Administered 2019-07-20: 400 mg via INTRAMUSCULAR

## 2019-07-20 MED ORDER — ARIPIPRAZOLE ER 400 MG IM SRER: 400.0000 mg | INTRAMUSCULAR | 11 refills | Status: DC

## 2019-07-20 MED ORDER — NITROFURANTOIN MONOHYD MACRO 100 MG PO CAPS: 100.0000 mg | ORAL_CAPSULE | Freq: Two times a day (BID) | ORAL | 0 refills | Status: DC

## 2019-07-20 MED ORDER — OLANZAPINE 15 MG PO TABS: 15.0000 mg | ORAL_TABLET | Freq: Every day | ORAL | 1 refills | Status: DC

## 2019-07-20 NOTE — Progress Notes (Signed)
   07/20/19 0100  Psych Admission Type (Psych Patients Only)  Admission Status Involuntary  Psychosocial Assessment  Patient Complaints None  Eye Contact Fair  Facial Expression Flat  Affect Anxious;Appropriate to circumstance  Speech Logical/coherent  Interaction Assertive  Motor Activity Other (Comment) (WNL)  Appearance/Hygiene Unremarkable  Behavior Characteristics Cooperative  Mood Suspicious;Anxious  Thought Process  Coherency WDL  Content Blaming self  Delusions None reported or observed  Perception WDL  Hallucination None reported or observed  Judgment Poor  Confusion None  Danger to Self  Current suicidal ideation? Denies  Danger to Others  Danger to Others None reported or observed   Pt paranoid on the unit this evening

## 2019-07-20 NOTE — Progress Notes (Signed)
Pt discharged to lobby. Pt was stable and appreciative at that time. All papers and prescriptions were given and valuables returned. Verbal understanding expressed. Denies SI/HI and A/VH. Pt given opportunity to express concerns and ask questions.  

## 2019-07-20 NOTE — Progress Notes (Signed)
  [facility] Adult Case Management Discharge Plan :  Will you be returning to the same living situation after discharge:  Yes,  Pt's father; pt's father stated that she can come back and live with him At discharge, do you have transportation home?: Yes,  [employer] at 11am Do you have the ability to pay for your medications: Yes,  medicaid  Release of information consent forms completed and in the chart;  Patient's signature needed at discharge.  Patient to Follow up at: Follow-up Information    [facility] Follow up on 07/22/2019.   Why: You have a telephone appointment scheduled with Beverly Sessions on Thursday, November 19th at 11:30am. Please have your hospital discharge paperwork with you. They will contact you at [phone]. Contact information: [address] [place], [place] [address] Phone:  [phone] Fax:  [phone]          Next level of care provider has access to [place] and Suicide Prevention discussed: No.; Pt declined; SPE with pt  Have you used any form of tobacco in the last 30 days? (Cigarettes, Smokeless Tobacco, Cigars, and/or Pipes): Yes  Has patient been referred to the Quitline?: Patient refused referral  Patient has been referred for addiction treatment: Yes  Trecia Rogers, LCSW 07/20/2019, 9:23 AM

## 2019-07-20 NOTE — BHH Counselor (Signed)
CSW scheduled [employer] for patient for 11:00am. CSW notified and confirmed with pt's nurse.      Ardelle Anton, MSW, [facility] Phone: [phone] Fax: [phone]

## 2019-07-20 NOTE — Discharge Summary (Signed)
Physician Discharge Summary Note  Patient:  Denise Clark is an 28 y.o., female MRN:  [id number] DOB:  1990/12/25 Patient phone:  [phone] (home)  Patient address:   [address] [place] [address],  Total Time spent with patient: 45 minutes  Date of Admission:  07/16/2019 Date of Discharge: 07/20/2019  Reason for Admission:    Denise Clark is new to our system but she has had a prior outpatient diagnosis of schizophrenia, complicated by cannabis use versus dependence, and had been prescribed Zyprexa in the past.  She was brought in under petition for involuntary commitment, wandering in the street disorganized so forth, see the admission note drug screen to reflect the use of cannabis. Her father reports to me that he had never seen her in this state before although he knew she had had some degree of mental health treatment but this was all very vague to him.  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 Bipolar disorder (HCC)   . Ectopic pregnancy   . Schizophrenia [facility])     Past Surgical History:  Procedure Laterality Date  . CESAREAN SECTION    . ECTOPIC PREGNANCY SURGERY    . ovarian pregnancy     Family History: History reviewed. No pertinent family history. Family Psychiatric  History: None Social History:  Social History   Substance and Sexual Activity  Alcohol Use No     Social History   Substance and Sexual Activity  Drug Use No    Social History   Socioeconomic History  . Marital status: Single    Spouse name: Not on file  . Number of children: Not on file  . Years of education: Not on file  . Highest education level: Not on file  Occupational History  . Not on file  Social Needs  .  Financial resource strain: Not on file  . Food insecurity    Worry: Not on file    Inability: Not on file  . Transportation needs    Medical: Not on file    Non-medical: Not on file  Tobacco Use  . Smoking status: Current Some Day Smoker    Packs/day: 0.05    Types: Cigars, Cigarettes  . Smokeless tobacco: Never Used  Substance and Sexual Activity  . Alcohol use: No  . Drug use: No  . Sexual activity: Yes    Birth control/protection: None, Implant  Lifestyle  . Physical activity    Days per week: Not on file    Minutes per session: Not on file  . Stress: Not on file  Relationships  . Social Musician on phone: Not on file    Gets together: Not on file    Attends religious service: Not on file    Active member of club or organization: Not on file    Attends meetings of clubs or organizations: Not on file    Relationship status: Not on file  Other Topics Concern  . Not on file  Social History Narrative  . Not on file    Hospital Course:    Patient was admitted under routine precautions and she really had no recall of her confusional state and argue that she simply did not need to be hospitalized but again she  recalibrated very quickly to what we thought might be her baseline, she was compliant with the olanzapine but because of potential noncompliance at home she agreed to long-acting injectable aripiprazole.  She displayed no toxicity or signs or symptoms of sensitivity to this agent with test dosing at 2 mg a day so we ordered the shot prior to discharge on 11/17.  Again she seems fully coherent goal-directed other than not really recalling her confusional state she was seemingly quite baseline and she is employed at baseline.  We think it may simply be a drug induced psychosis but because she has been diagnosed with a schizophrenic condition in the past were going to go ahead and continue antipsychotic therapy as discussed above.  The point of discharge is very  stable in mood and affect cordial and cooperative  Physical Findings: AIMS: Facial and Oral Movements Muscles of Facial Expression: None, normal Lips and Perioral Area: None, normal Jaw: None, normal Tongue: None, normal,Extremity Movements Upper (arms, wrists, hands, fingers): None, normal Lower (legs, knees, ankles, toes): None, normal, Trunk Movements Neck, shoulders, hips: None, normal, Overall Severity Severity of abnormal movements (highest score from questions above): None, normal Incapacitation due to abnormal movements: None, normal Patient's awareness of abnormal movements (rate only patient's report): No Awareness, Dental Status Current problems with teeth and/or dentures?: Yes Does patient usually wear dentures?: No  CIWA:   0 COWS:   0 Have you used any form of tobacco in the last 30 days? (Cigarettes, Smokeless Tobacco, Cigars, and/or Pipes): Yes  Has this patient used any form of tobacco in the last 30 days? (Cigarettes, Smokeless Tobacco, Cigars, and/or Pipes) Yes, No Musculoskeletal: Strength & Muscle Tone: within normal limits Gait & Station: normal Patient leans: N/A  Psychiatric Specialty Exam: ROS  Blood pressure 127/79, pulse 80, temperature 98.2 F (36.8 C), temperature source Oral, resp. rate 16, height 5\' 5"  (1.651 m), weight 69.4 kg, last menstrual period 06/15/2019.Body mass index is 25.46 kg/m.  General Appearance: Casual  Eye Contact::  Good  Speech:  Clear and Coherent409  Volume:  Normal  Mood:  Euthymic  Affect:  Full Range  Thought Process:  Coherent and Descriptions of Associations: Circumstantial  Orientation:  Full (Time, Place, and Person)  Thought Content:  Rumination but denies auditory and visual hallucinations denies thoughts of harming self or others  Suicidal Thoughts:  No  Homicidal Thoughts:  No  Memory:  Immediate;   Fair Recent;   Fair Remote;   Fair  Judgement:  Fair  Insight:  Fair  Psychomotor Activity:  Normal   Concentration:  Fair  Recall:  [employer] of Knowledge:Fair  Language: Fair  Akathisia:  Negative  Handed:  Right  AIMS (if indicated):     Assets:  Communication Skills Desire for Improvement  Sleep:  Number of Hours: 6.75  Cognition: WNL  ADL's:  Intact    Blood Alcohol level:  Lab Results  Component Value Date   ETH <10 07/14/2019   ETH  04/22/2009    10        LOWEST DETECTABLE LIMIT FOR SERUM ALCOHOL IS 5 mg/dL FOR MEDICAL PURPOSES ONLY    Metabolic Disorder Labs:  Lab Results  Component Value Date   HGBA1C 5.2 07/17/2019   MPG 102.54 07/17/2019   No results found for: PROLACTIN Lab Results  Component Value Date   CHOL 166 07/17/2019   TRIG 79 07/17/2019   HDL 33 (L) 07/17/2019   CHOLHDL 5.0 07/17/2019   VLDL  16 07/17/2019   LDLCALC 117 (H) 07/17/2019    See Psychiatric Specialty Exam and Suicide Risk Assessment completed by Attending Physician prior to discharge.  Discharge destination:  Home  Is patient on multiple antipsychotic therapies at discharge:  No   Has Patient had three or more failed trials of antipsychotic monotherapy by history:  No  Recommended Plan for Multiple Antipsychotic Therapies: NA   Allergies as of 07/20/2019   No Known Allergies     Medication List    TAKE these medications     Indication  ARIPiprazole ER 400 MG Srer injection Commonly known as: ABILIFY MAINTENA Inject 2 mLs (400 mg total) into the muscle every 28 (twenty-eight) days. Due 12/17  Indication: Schizophrenia   Implanon 68 MG Impl implant Generic drug: etonogestrel 1 each by Subdermal route once.  Indication: Birth Control Treatment   nitrofurantoin (macrocrystal-monohydrate) 100 MG capsule Commonly known as: MACROBID Take 1 capsule (100 mg total) by mouth 2 (two) times daily.  Indication: Bacteria in Urine   OLANZapine 15 MG tablet Commonly known as: ZYPREXA Take 1 tablet (15 mg total) by mouth at bedtime.  Indication: Schizophrenia       Follow-up Information    [facility] Follow up on 07/22/2019.   Why: You have a telephone appointment scheduled [facility] on Thursday, November 19th at 11:30am. Please have your hospital discharge paperwork with you. They will contact you at [phone]. Contact information: [address] [place], [place] [address] Phone:  [phone] Fax:  [phone]          Signed: Malvin JohnsFARAH,Jendayi Berling, MD 07/20/2019, 8:08 AM

## 2019-07-20 NOTE — Progress Notes (Signed)
Adult Psychoeducational Group Note  Date:  07/20/2019 Time:  1:42 AM  Group Topic/Focus:  Wrap-Up Group:   The focus of this group is to help patients review their daily goal of treatment and discuss progress on daily workbooks.  Participation Level:  Active  Participation Quality:  Appropriate  Affect:  Appropriate  Cognitive:  Alert  Insight: Appropriate  Engagement in Group:  Engaged  Modes of Intervention:  Discussion  Additional Comments:  Patient stated having an okay day. Patient's goal for today was to get help for herself and her son.   Raj Landress L Markey Deady 07/20/2019, 1:42 AM

## 2019-07-20 NOTE — [facility]HH Suicide Risk Assessment (Signed)
[facility] Discharge Suicide Risk Assessment   Principal Problem: Psychosis complicated by cannabis usage Discharge Diagnoses: Active Problems:   Psychosis ([place])   Bipolar I disorder, most recent episode (or current) manic ([place])   Total Time spent with patient: 45 minutes  Musculoskeletal: Strength & Muscle Tone: within normal limits Gait & Station: normal Patient leans: N/A  Psychiatric Specialty Exam: ROS  Blood pressure 127/79, pulse 80, temperature 98.2 F (36.8 C), temperature source Oral, resp. rate 16, height 5\' 5"  (1.651 m), weight 69.4 kg, last menstrual period 06/15/2019.Body mass index is 25.46 kg/m.  General Appearance: Casual  Eye Contact::  Good  Speech:  Clear and Coherent409  Volume:  Normal  Mood:  Euthymic  Affect:  Full Range  Thought Process:  Coherent and Descriptions of Associations: Circumstantial  Orientation:  Full (Time, Place, and Person)  Thought Content:  Rumination but denies auditory and visual hallucinations denies thoughts of harming self or others  Suicidal Thoughts:  No  Homicidal Thoughts:  No  Memory:  Immediate;   Fair Recent;   Fair Remote;   Fair  Judgement:  Fair  Insight:  Fair  Psychomotor Activity:  Normal  Concentration:  Fair  Recall:  [employer] of Knowledge:Fair  Language: Fair  Akathisia:  Negative  Handed:  Right  AIMS (if indicated):     Assets:  Communication Skills Desire for Improvement  Sleep:  Number of Hours: 6.75  Cognition: WNL  ADL's:  Intact   Mental Status Per Nursing Assessment::   On Admission:  NA  Demographic Factors:  NA  Loss Factors: NA  Historical Factors: NA  Risk Reduction Factors:   Responsible for children under 50 years of age, Sense of responsibility to family, Religious beliefs about death, Employed, Positive social support and Positive coping skills or problem solving skills  Continued Clinical Symptoms:  Previous Psychiatric Diagnoses and Treatments  Cognitive Features That  Contribute To Risk:  None    Suicide Risk:  Minimal: No identifiable suicidal ideation.  Patients presenting with no risk factors but with morbid ruminations; may be classified as minimal risk based on the severity of the depressive symptoms  Follow-up Information    [facility] Follow up on 07/22/2019.   Why: You have a telephone appointment scheduled with Beverly Sessions on Thursday, November 19th at 11:30am. Please have your hospital discharge paperwork with you. They will contact you at [phone]. Contact information: [address] [place],  [address] Phone:  [phone] Fax:  [phone]          Plan Of Care/Follow-up recommendations:  Activity:  full  Graydon Fofana, MD 07/20/2019, 8:01 AM

## 2019-07-20 NOTE — Progress Notes (Signed)
Recreation Therapy Notes  Date: 11.17.20 Time: 0950 Location: [address] Dayroom  Group Topic: Self-Esteem  Goal Area(s) Addresses:  Patient will successfully identify positive attributes about themselves.  Patient will successfully identify benefit of improved self-esteem.   Behavioral Response: Engaged  Intervention: Visual merchandiser, scissors, glue sticks, magazines  Activity: Collage About Me.  Patients were to create a collage that highlights their positive qualities or describes them.  Patients could use words or just pictures that tell about them.  Education:  Self-Esteem, Dentist.   Education Outcome: Acknowledges education/In group clarification offered/Needs additional education  Clinical Observations/Feedback: Pt found the phrase "live best life" to mean she wanted to do better once discharged.  Pt used a picture of a family to show wanting to get close to and talk to her family more; thread- to show her life has been going in circles and wanting to get life in order to get her son back; and a picture of the show Ethelene Hal, which represented "my family crazy like them".  Pt was bright and more engaged.     Victorino Sparrow, LRT/CTRS     Victorino Sparrow A 07/20/2019 11:18 AM

## 2019-11-22 ENCOUNTER — Other Ambulatory Visit: Payer: Self-pay

## 2019-11-22 DIAGNOSIS — R112 Nausea with vomiting, unspecified: Secondary | ICD-10-CM | POA: Insufficient documentation

## 2019-11-22 DIAGNOSIS — R519 Headache, unspecified: Secondary | ICD-10-CM | POA: Diagnosis present

## 2019-11-22 DIAGNOSIS — F17210 Nicotine dependence, cigarettes, uncomplicated: Secondary | ICD-10-CM | POA: Insufficient documentation

## 2019-11-22 DIAGNOSIS — B349 Viral infection, unspecified: Secondary | ICD-10-CM | POA: Diagnosis not present

## 2019-11-23 ENCOUNTER — Other Ambulatory Visit: Payer: Self-pay

## 2019-11-23 ENCOUNTER — Encounter (HOSPITAL_COMMUNITY): Payer: Self-pay | Admitting: Emergency Medicine

## 2019-11-23 ENCOUNTER — Emergency Department (HOSPITAL_COMMUNITY)
Admission: EM | Admit: 2019-11-23 | Discharge: 2019-11-23 | Disposition: A | Discharge status: Home / Self Care | Payer: Medicaid Other | Attending: Emergency Medicine | Admitting: Emergency Medicine

## 2019-11-23 DIAGNOSIS — B349 Viral infection, unspecified: Secondary | ICD-10-CM

## 2019-11-23 MED ORDER — KETOROLAC TROMETHAMINE 30 MG/ML IJ SOLN
30.0000 mg | Freq: Once | INTRAMUSCULAR | Status: AC
  Administered 2019-11-23: 30 mg via INTRAMUSCULAR
  Filled 2019-11-23: qty 1

## 2019-11-23 MED ORDER — LOPERAMIDE HCL 2 MG PO CAPS: 2.0000 mg | ORAL_CAPSULE | Freq: Four times a day (QID) | ORAL | 0 refills | Status: DC | PRN

## 2019-11-23 MED ORDER — PROMETHAZINE HCL 25 MG PO TABS: 25.0000 mg | ORAL_TABLET | Freq: Four times a day (QID) | ORAL | 0 refills | Status: DC | PRN

## 2019-11-23 MED ORDER — PROMETHAZINE HCL 25 MG/ML IJ SOLN
25.0000 mg | Freq: Once | INTRAMUSCULAR | Status: AC
  Administered 2019-11-23: 25 mg via INTRAMUSCULAR
  Filled 2019-11-23: qty 1

## 2019-11-23 NOTE — ED Triage Notes (Signed)
Pt states she has headache with n/v x one day.

## 2019-11-23 NOTE — ED Provider Notes (Signed)
[facility] EMERGENCY DEPARTMENT Provider Note   CSN: [id number] Arrival date & time: 11/22/19  2307     History Chief Complaint  Patient presents with  . Headache    Denise Clark is a 29 y.o. female.  Patient presents with 2 of her close associates with flulike symptoms.  She reports that she is experiencing headache, nausea and vomiting.  No vision change.  No neck pain or stiffness.  No fever.        Past Medical History:  Diagnosis Date  . Bipolar disorder ([place])   . Ectopic pregnancy   . Schizophrenia [facility])     Patient Active Problem List   Diagnosis Date Noted  . Bipolar I disorder, most recent episode (or current) manic ([place]) 07/18/2019  . Psychosis ([place]) 07/16/2019    Past Surgical History:  Procedure Laterality Date  . CESAREAN SECTION    . ECTOPIC PREGNANCY SURGERY    . ovarian pregnancy       OB History    Gravida  2   Para      Term      Preterm      AB  1   Living  1     SAB      TAB      Ectopic  1   Multiple      Live Births              No family history on file.  Social History   Tobacco Use  . Smoking status: Current Some Day Smoker    Packs/day: 0.05    Types: Cigars, Cigarettes  . Smokeless tobacco: Never Used  Substance Use Topics  . Alcohol use: No  . Drug use: No    Home Medications Prior to Admission medications   Medication Sig Start Date End Date Taking? Authorizing Provider  ARIPiprazole ER (ABILIFY MAINTENA) 400 MG SRER injection Inject 2 mLs (400 mg total) into the muscle every 28 (twenty-eight) days. Due 12/17 07/20/19   Johnn Hai, MD  etonogestrel (IMPLANON) 68 MG IMPL implant 1 each by Subdermal route once.    [provider]  loperamide (IMODIUM) 2 MG capsule Take 1 capsule (2 mg total) by mouth 4 (four) times daily as needed for diarrhea or loose stools. 11/23/19   Orpah Greek, MD  nitrofurantoin, macrocrystal-monohydrate, (MACROBID) 100 MG capsule Take 1 capsule (100  mg total) by mouth 2 (two) times daily. 07/20/19   Johnn Hai, MD  OLANZapine (ZYPREXA) 15 MG tablet Take 1 tablet (15 mg total) by mouth at bedtime. 07/20/19   Johnn Hai, MD  promethazine (PHENERGAN) 25 MG tablet Take 1 tablet (25 mg total) by mouth every 6 (six) hours as needed for nausea or vomiting. 11/23/19   Jakeem Grape, Gwenyth Allegra, MD    Allergies    Patient has no known allergies.  Review of Systems   Review of Systems  Gastrointestinal: Positive for nausea and vomiting.  Neurological: Positive for headaches.  All other systems reviewed and are negative.   Physical Exam Updated Vital Signs BP (!) 145/80 (BP Location: Right Arm)   Temp 98.4 F (36.9 C) (Oral)   Resp 17   Ht 5\' 4"  (1.626 m)   Wt 72.6 kg   LMP 11/10/2019   SpO2 100%   BMI 27.46 kg/m   Physical Exam Vitals and nursing note reviewed.  Constitutional:      General: She is not in acute distress.    Appearance: Normal appearance.  She is well-developed.  HENT:     Head: Normocephalic and atraumatic.     Right Ear: Hearing normal.     Left Ear: Hearing normal.     Nose: Nose normal.  Eyes:     Conjunctiva/sclera: Conjunctivae normal.     Pupils: Pupils are equal, round, and reactive to light.  Cardiovascular:     Rate and Rhythm: Regular rhythm.     Heart sounds: S1 normal and S2 normal. No murmur. No friction rub. No gallop.   Pulmonary:     Effort: Pulmonary effort is normal. No respiratory distress.     Breath sounds: Normal breath sounds.  Chest:     Chest wall: No tenderness.  Abdominal:     General: Bowel sounds are normal.     Palpations: Abdomen is soft.     Tenderness: There is no abdominal tenderness. There is no guarding or rebound. Negative signs include Murphy's sign and McBurney's sign.     Hernia: No hernia is present.  Musculoskeletal:        General: Normal range of motion.     Cervical back: Normal range of motion and neck supple.  Skin:    General: Skin is warm and dry.      Findings: No rash.  Neurological:     Mental Status: She is alert and oriented to person, place, and time.     GCS: GCS eye subscore is 4. GCS verbal subscore is 5. GCS motor subscore is 6.     Cranial Nerves: No cranial nerve deficit.     Sensory: No sensory deficit.     Coordination: Coordination normal.  Psychiatric:        Speech: Speech normal.        Behavior: Behavior normal.        Thought Content: Thought content normal.     ED Results / Procedures / Treatments   Labs (all labs ordered are listed, but only abnormal results are displayed) Labs Reviewed - No data to display  EKG None  Radiology No results found.  Procedures Procedures (including critical care time)  Medications Ordered in ED Medications  promethazine (PHENERGAN) injection 25 mg (has no administration in time range)  ketorolac (TORADOL) 30 MG/ML injection 30 mg (has no administration in time range)    ED Course  I have reviewed the triage vital signs and the nursing notes.  Pertinent labs & imaging results that were available during my care of the patient were reviewed by me and considered in my medical decision making (see chart for details).    MDM Rules/Calculators/A&P                      This is a patient who presents with 2 close contacts, all of which are experiencing flu and GI symptoms.  This appears to be infectious, likely viral.  She has a headache but there is no neck pain, neck stiffness.  She is afebrile.  She has a normal neurologic exam.  Abdominal exam is benign.  She has vomited once but does not appear dehydrated.  As she appears well, does not require any work-up, treat symptomatically.  Final Clinical Impression(s) / ED Diagnoses Final diagnoses:  Viral syndrome    Rx / DC Orders ED Discharge Orders         Ordered    promethazine (PHENERGAN) 25 MG tablet  Every 6 hours PRN     11/23/19 0048    loperamide (IMODIUM)  2 MG capsule  4 times daily PRN     11/23/19 0048             Gilda Crease, MD 11/23/19 [phone]

## 2019-11-24 ENCOUNTER — Other Ambulatory Visit: Payer: Self-pay

## 2019-11-24 ENCOUNTER — Emergency Department (HOSPITAL_COMMUNITY)
Admission: EM | Admit: 2019-11-24 | Discharge: 2019-11-24 | Disposition: A | Discharge status: Home / Self Care | Payer: Medicaid Other

## 2022-12-24 ENCOUNTER — Observation Stay (HOSPITAL_BASED_OUTPATIENT_CLINIC_OR_DEPARTMENT_OTHER)
Admission: AD | Admit: 2022-12-24 | Discharge: 2022-12-25 | Disposition: A | Discharge status: Home / Self Care | Payer: Medicaid Other | Attending: Obstetrics & Gynecology | Admitting: Obstetrics & Gynecology

## 2022-12-24 ENCOUNTER — Encounter (HOSPITAL_BASED_OUTPATIENT_CLINIC_OR_DEPARTMENT_OTHER): Payer: Self-pay

## 2022-12-24 ENCOUNTER — Inpatient Hospital Stay (HOSPITAL_COMMUNITY): Payer: Medicaid Other | Admitting: Certified Registered Nurse Anesthetist

## 2022-12-24 ENCOUNTER — Encounter (HOSPITAL_COMMUNITY)
Admission: AD | Disposition: A | Discharge status: Home / Self Care | Payer: Self-pay | Source: Home / Self Care | Attending: Emergency Medicine

## 2022-12-24 ENCOUNTER — Emergency Department (HOSPITAL_COMMUNITY)
Admission: EM | Admit: 2022-12-24 | Discharge: 2022-12-24 | Discharge status: Against Medical Advice | Payer: Medicaid Other | Source: Home / Self Care

## 2022-12-24 ENCOUNTER — Inpatient Hospital Stay (HOSPITAL_BASED_OUTPATIENT_CLINIC_OR_DEPARTMENT_OTHER): Payer: Medicaid Other | Admitting: Certified Registered Nurse Anesthetist

## 2022-12-24 ENCOUNTER — Other Ambulatory Visit: Payer: Self-pay

## 2022-12-24 ENCOUNTER — Emergency Department (HOSPITAL_BASED_OUTPATIENT_CLINIC_OR_DEPARTMENT_OTHER): Payer: Medicaid Other

## 2022-12-24 DIAGNOSIS — Z5321 Procedure and treatment not carried out due to patient leaving prior to being seen by health care provider: Secondary | ICD-10-CM | POA: Insufficient documentation

## 2022-12-24 DIAGNOSIS — O008 Other ectopic pregnancy without intrauterine pregnancy: Secondary | ICD-10-CM | POA: Diagnosis not present

## 2022-12-24 DIAGNOSIS — N12 Tubulo-interstitial nephritis, not specified as acute or chronic: Secondary | ICD-10-CM

## 2022-12-24 DIAGNOSIS — N390 Urinary tract infection, site not specified: Secondary | ICD-10-CM | POA: Diagnosis present

## 2022-12-24 DIAGNOSIS — R1030 Lower abdominal pain, unspecified: Secondary | ICD-10-CM

## 2022-12-24 DIAGNOSIS — F17210 Nicotine dependence, cigarettes, uncomplicated: Secondary | ICD-10-CM | POA: Insufficient documentation

## 2022-12-24 DIAGNOSIS — O009 Unspecified ectopic pregnancy without intrauterine pregnancy: Principal | ICD-10-CM | POA: Diagnosis present

## 2022-12-24 DIAGNOSIS — R1084 Generalized abdominal pain: Secondary | ICD-10-CM | POA: Insufficient documentation

## 2022-12-24 DIAGNOSIS — Z3A00 Weeks of gestation of pregnancy not specified: Secondary | ICD-10-CM

## 2022-12-24 HISTORY — PX: DIAGNOSTIC LAPAROSCOPY WITH REMOVAL OF ECTOPIC PREGNANCY: SHX6449

## 2022-12-24 LAB — CBC
HCT: 33.7 % — ABNORMAL LOW (ref 36.0–46.0)
HCT: 30.2 % — ABNORMAL LOW (ref 36.0–46.0)
Hemoglobin: 11.3 g/dL — ABNORMAL LOW (ref 12.0–15.0)
Hemoglobin: 9.8 g/dL — ABNORMAL LOW (ref 12.0–15.0)
MCH: 30.8 pg (ref 26.0–34.0)
MCH: 30.5 pg (ref 26.0–34.0)
MCHC: 33.5 g/dL (ref 30.0–36.0)
MCHC: 32.5 g/dL (ref 30.0–36.0)
MCV: 91.8 fL (ref 80.0–100.0)
MCV: 94.1 fL (ref 80.0–100.0)
Platelets: 375 10*3/uL (ref 150–400)
Platelets: 340 10*3/uL (ref 150–400)
RBC: 3.67 MIL/uL — ABNORMAL LOW (ref 3.87–5.11)
RBC: 3.21 MIL/uL — ABNORMAL LOW (ref 3.87–5.11)
RDW: 14.4 % (ref 11.5–15.5)
RDW: 14.2 % (ref 11.5–15.5)
WBC: 12.9 10*3/uL — ABNORMAL HIGH (ref 4.0–10.5)
WBC: 13.8 10*3/uL — ABNORMAL HIGH (ref 4.0–10.5)
nRBC: 0 % (ref 0.0–0.2)
nRBC: 0 % (ref 0.0–0.2)

## 2022-12-24 LAB — URINALYSIS, ROUTINE W REFLEX MICROSCOPIC
Bilirubin Urine: NEGATIVE
Glucose, UA: NEGATIVE mg/dL
Hgb urine dipstick: NEGATIVE
Ketones, ur: >80 mg/dL — AB
Leukocytes,Ua: NEGATIVE
Nitrite: POSITIVE — AB
Specific Gravity, Urine: 1.024 (ref 1.005–1.030)
pH: 6 (ref 5.0–8.0)

## 2022-12-24 LAB — PREGNANCY, URINE: Preg Test, Ur: POSITIVE — AB

## 2022-12-24 LAB — HCG, QUANTITATIVE, PREGNANCY
hCG, Beta Chain, Quant, S: <1 m[IU]/mL (ref <5)
hCG, Beta Chain, Quant, S: 9204 m[IU]/mL — ABNORMAL HIGH (ref <5)

## 2022-12-24 LAB — COMPREHENSIVE METABOLIC PANEL
ALT: 10 U/L (ref 0–44)
AST: 11 U/L — ABNORMAL LOW (ref 15–41)
Albumin: 4.4 g/dL (ref 3.5–5.0)
Alkaline Phosphatase: 52 U/L (ref 38–126)
Anion gap: 8 (ref 5–15)
BUN: 6 mg/dL (ref 6–20)
CO2: 22 mmol/L (ref 22–32)
Calcium: 9.4 mg/dL (ref 8.9–10.3)
Chloride: 103 mmol/L (ref 98–111)
Creatinine, Ser: 0.61 mg/dL (ref 0.44–1.00)
GFR, Estimated: >60 mL/min (ref >60)
Glucose, Bld: 117 mg/dL — ABNORMAL HIGH (ref 70–99)
Potassium: 3.6 mmol/L (ref 3.5–5.1)
Sodium: 133 mmol/L — ABNORMAL LOW (ref 135–145)
Total Bilirubin: 0.4 mg/dL (ref 0.3–1.2)
Total Protein: 7.5 g/dL (ref 6.5–8.1)

## 2022-12-24 LAB — TYPE AND SCREEN
ABO/RH(D): O POS
Antibody Screen: NEGATIVE

## 2022-12-24 LAB — LIPASE, BLOOD: Lipase: <10 U/L — ABNORMAL LOW (ref 11–51)

## 2022-12-24 SURGERY — LAPAROSCOPY, WITH ECTOPIC PREGNANCY SURGICAL TREATMENT
Anesthesia: General | Site: Abdomen

## 2022-12-24 MED ORDER — MIDAZOLAM HCL 2 MG/2ML IJ SOLN
INTRAMUSCULAR | Status: DC | PRN
  Administered 2022-12-24: 2 mg via INTRAVENOUS

## 2022-12-24 MED ORDER — FENTANYL CITRATE (PF) 100 MCG/2ML IJ SOLN
50.0000 ug | Freq: Once | INTRAMUSCULAR | Status: AC
  Administered 2022-12-24: 50 ug via INTRAVENOUS
  Filled 2022-12-24: qty 2

## 2022-12-24 MED ORDER — FENTANYL CITRATE (PF) 250 MCG/5ML IJ SOLN
INTRAMUSCULAR | Status: AC
  Filled 2022-12-24: qty 5

## 2022-12-24 MED ORDER — LIDOCAINE 2% (20 MG/ML) 5 ML SYRINGE
INTRAMUSCULAR | Status: DC | PRN
  Administered 2022-12-24: 60 mg via INTRAVENOUS

## 2022-12-24 MED ORDER — PROPOFOL 10 MG/ML IV BOLUS
INTRAVENOUS | Status: DC | PRN
  Administered 2022-12-24: 140 mg via INTRAVENOUS

## 2022-12-24 MED ORDER — PROPOFOL 10 MG/ML IV BOLUS
INTRAVENOUS | Status: AC
  Filled 2022-12-24: qty 20

## 2022-12-24 MED ORDER — ROCURONIUM BROMIDE 10 MG/ML (PF) SYRINGE
PREFILLED_SYRINGE | INTRAVENOUS | Status: DC | PRN
  Administered 2022-12-24: 70 mg via INTRAVENOUS

## 2022-12-24 MED ORDER — LACTATED RINGERS IV SOLN: INTRAVENOUS | Status: DC | PRN

## 2022-12-24 MED ORDER — SCOPOLAMINE 1 MG/3DAYS TD PT72
MEDICATED_PATCH | TRANSDERMAL | Status: DC | PRN
  Administered 2022-12-24: 1 via TRANSDERMAL

## 2022-12-24 MED ORDER — SODIUM CHLORIDE 0.9 % IV BOLUS
1000.0000 mL | Freq: Once | INTRAVENOUS | Status: AC
  Administered 2022-12-24: 1000 mL via INTRAVENOUS

## 2022-12-24 MED ORDER — SODIUM CHLORIDE 0.9 % IV SOLN
1.0000 g | Freq: Once | INTRAVENOUS | Status: AC
  Administered 2022-12-24: 1 g via INTRAVENOUS
  Filled 2022-12-24: qty 10

## 2022-12-24 MED ORDER — ONDANSETRON HCL 4 MG/2ML IJ SOLN
INTRAMUSCULAR | Status: DC | PRN
  Administered 2022-12-24: 4 mg via INTRAVENOUS

## 2022-12-24 MED ORDER — MORPHINE SULFATE (PF) 4 MG/ML IV SOLN: 4.0000 mg | Freq: Once | INTRAVENOUS | Status: DC

## 2022-12-24 MED ORDER — MIDAZOLAM HCL 2 MG/2ML IJ SOLN
INTRAMUSCULAR | Status: AC
  Filled 2022-12-24: qty 2

## 2022-12-24 MED ORDER — DEXAMETHASONE SODIUM PHOSPHATE 10 MG/ML IJ SOLN
INTRAMUSCULAR | Status: DC | PRN
  Administered 2022-12-24: 10 mg via INTRAVENOUS

## 2022-12-24 MED ORDER — BUPIVACAINE HCL (PF) 0.25 % IJ SOLN
INTRAMUSCULAR | Status: AC
  Filled 2022-12-24: qty 30

## 2022-12-24 MED ORDER — ALBUMIN HUMAN 5 % IV SOLN: INTRAVENOUS | Status: DC | PRN

## 2022-12-24 MED ORDER — PHENYLEPHRINE 80 MCG/ML (10ML) SYRINGE FOR IV PUSH (FOR BLOOD PRESSURE SUPPORT)
PREFILLED_SYRINGE | INTRAVENOUS | Status: DC | PRN
  Administered 2022-12-24: 160 ug via INTRAVENOUS
  Administered 2022-12-24: 240 ug via INTRAVENOUS

## 2022-12-24 MED ORDER — FENTANYL CITRATE PF 50 MCG/ML IJ SOSY
50.0000 ug | PREFILLED_SYRINGE | Freq: Once | INTRAMUSCULAR | Status: AC
  Administered 2022-12-24: 50 ug via INTRAVENOUS
  Filled 2022-12-24: qty 1

## 2022-12-24 MED ORDER — ONDANSETRON HCL 4 MG/2ML IJ SOLN
4.0000 mg | Freq: Once | INTRAMUSCULAR | Status: AC
  Administered 2022-12-24: 4 mg via INTRAVENOUS
  Filled 2022-12-24: qty 2

## 2022-12-24 MED ORDER — FENTANYL CITRATE (PF) 250 MCG/5ML IJ SOLN
INTRAMUSCULAR | Status: DC | PRN
  Administered 2022-12-24 – 2022-12-25 (×5): 50 ug via INTRAVENOUS

## 2022-12-24 MED ORDER — BUPIVACAINE HCL (PF) 0.25 % IJ SOLN
INTRAMUSCULAR | Status: DC | PRN
  Administered 2022-12-24: 30 mL

## 2022-12-24 MED ORDER — SODIUM CHLORIDE 0.9 % IR SOLN
Status: DC | PRN
  Administered 2022-12-24: 1000 mL

## 2022-12-24 SURGICAL SUPPLY — 37 items
ADH SKN CLS APL DERMABOND .7 (GAUZE/BANDAGES/DRESSINGS) ×1
APL SRG 38 LTWT LNG FL B (MISCELLANEOUS) ×1
APPLICATOR ARISTA FLEXITIP XL (MISCELLANEOUS) IMPLANT
DERMABOND ADVANCED .7 DNX12 (GAUZE/BANDAGES/DRESSINGS) ×1 IMPLANT
DRSG OPSITE POSTOP 3X4 (GAUZE/BANDAGES/DRESSINGS) IMPLANT
DURAPREP 26ML APPLICATOR (WOUND CARE) ×1 IMPLANT
GLOVE BIOGEL PI IND STRL 7.0 (GLOVE) ×4 IMPLANT
GLOVE ECLIPSE 6.5 STRL STRAW (GLOVE) ×1 IMPLANT
GOWN STRL REUS W/ TWL LRG LVL3 (GOWN DISPOSABLE) ×2 IMPLANT
GOWN STRL REUS W/TWL LRG LVL3 (GOWN DISPOSABLE) ×2
HEMOSTAT ARISTA ABSORB 3G PWDR (HEMOSTASIS) IMPLANT
IRRIG SUCT STRYKERFLOW 2 WTIP (MISCELLANEOUS)
IRRIGATION SUCT STRKRFLW 2 WTP (MISCELLANEOUS) IMPLANT
KIT PINK PAD W/HEAD ARE REST (MISCELLANEOUS) ×1 IMPLANT
KIT PINK PAD W/HEAD ARM REST (MISCELLANEOUS) ×1 IMPLANT
KIT TURNOVER KIT B (KITS) ×1 IMPLANT
LIGASURE VESSEL 5MM BLUNT TIP (ELECTROSURGICAL) IMPLANT
NDL INSUFFLATION 14GA 120MM (NEEDLE) ×1 IMPLANT
NEEDLE INSUFFLATION 14GA 120MM (NEEDLE) ×1 IMPLANT
PACK LAPAROSCOPY BASIN (CUSTOM PROCEDURE TRAY) ×1 IMPLANT
PAD OB MATERNITY 4.3X12.25 (PERSONAL CARE ITEMS) ×1 IMPLANT
PROTECTOR NERVE ULNAR (MISCELLANEOUS) ×2 IMPLANT
SET TUBE SMOKE EVAC HIGH FLOW (TUBING) ×1 IMPLANT
SHEARS HARMONIC ACE PLUS 36CM (ENDOMECHANICALS) IMPLANT
SLEEVE Z-THREAD 5X100MM (TROCAR) ×1 IMPLANT
SOL ELECTROSURG ANTI STICK (MISCELLANEOUS) ×1
SOLUTION ELECTROSURG ANTI STCK (MISCELLANEOUS) ×1 IMPLANT
SUT MON AB 4-0 PS1 27 (SUTURE) ×1 IMPLANT
SUT VICRYL 0 UR6 27IN ABS (SUTURE) IMPLANT
SYS BAG RETRIEVAL 10MM (BASKET) ×1
SYSTEM BAG RETRIEVAL 10MM (BASKET) IMPLANT
SYSTEM CARTER THOMASON II (TROCAR) IMPLANT
TOWEL GREEN STERILE FF (TOWEL DISPOSABLE) ×2 IMPLANT
TRAY FOLEY W/BAG SLVR 14FR (SET/KITS/TRAYS/PACK) ×1 IMPLANT
TROCAR 11X100 Z THREAD (TROCAR) ×1 IMPLANT
TROCAR XCEL NON-BLD 5MMX100MML (ENDOMECHANICALS) ×1 IMPLANT
WARMER LAPAROSCOPE (MISCELLANEOUS) ×1 IMPLANT

## 2022-12-24 NOTE — Progress Notes (Signed)
Permit signed when Dr Charlotta Newton returned after initially talking with pt. Saline lock flushed well. Had 700cc of NS from bag hanging when pt arrived from [place]. To Main OR via stretcher

## 2022-12-24 NOTE — ED Triage Notes (Signed)
Pt reports abdominal pain starting this morning. Denies n/v/d.

## 2022-12-24 NOTE — Discharge Instructions (Addendum)
You are being transferred directly to [facility] for surgical management of a ruptured ectopic pregnancy

## 2022-12-24 NOTE — ED Notes (Signed)
Pt states she refuses blood work, she decided she is going home. Triage staff aware.

## 2022-12-24 NOTE — MAU Note (Signed)
Report called to Lelon Perla CRNA regarding pt's status

## 2022-12-24 NOTE — ED Provider Notes (Signed)
[facility] Provider Note   CSN: [id number] Arrival date & time: 12/24/22  1608     History  Chief Complaint  Patient presents with   Abdominal Pain    Denise Clark is a 32 y.o. female.  With a history of schizophrenia, bipolar, ectopic pregnancy when she was 14 presents to the ED for evaluation of lower abdominal pain.  This began shortly after waking up this morning.  She denies fevers, nausea, vomiting, diarrhea.  Had a normal nonbloody bowel movement this morning which did not improve her symptoms.  She denies history of similar.  She denies dysuria, frequency, urgency, hematuria, vaginal pain, odor, discharge.  She has not been sexually active for approximately 1 week. Last menstrual period was approximately one month ago.   Abdominal Pain      Home Medications Prior to Admission medications   Medication Sig Start Date End Date Taking? Authorizing Provider  ARIPiprazole ER (ABILIFY MAINTENA) 400 MG SRER injection Inject 2 mLs (400 mg total) into the muscle every 28 (twenty-eight) days. Due 12/17 07/20/19   Malvin Johns, MD  etonogestrel (IMPLANON) 68 MG IMPL implant 1 each by Subdermal route once.    [provider]  loperamide (IMODIUM) 2 MG capsule Take 1 capsule (2 mg total) by mouth 4 (four) times daily as needed for diarrhea or loose stools. 11/23/19   Gilda Crease, MD  nitrofurantoin, macrocrystal-monohydrate, (MACROBID) 100 MG capsule Take 1 capsule (100 mg total) by mouth 2 (two) times daily. 07/20/19   Malvin Johns, MD  OLANZapine (ZYPREXA) 15 MG tablet Take 1 tablet (15 mg total) by mouth at bedtime. 07/20/19   Malvin Johns, MD  promethazine (PHENERGAN) 25 MG tablet Take 1 tablet (25 mg total) by mouth every 6 (six) hours as needed for nausea or vomiting. 11/23/19   Pollina, Canary Brim, MD      Allergies    Patient has no known allergies.    Review of Systems   Review of Systems   Gastrointestinal:  Positive for abdominal pain.  All other systems reviewed and are negative.   Physical Exam Updated Vital Signs BP 118/64   Pulse 83   Temp 98 F (36.7 C) (Oral)   Resp (!) 29   Ht 5\' 4"  (1.626 m)   Wt 95.3 kg   LMP 11/20/2022 (Exact Date)   SpO2 100%   BMI 36.05 kg/m  Physical Exam Vitals and nursing note reviewed.  Constitutional:      General: She is in acute distress.     Appearance: She is well-developed. She is not ill-appearing, toxic-appearing or diaphoretic.  HENT:     Head: Normocephalic and atraumatic.  Eyes:     Conjunctiva/sclera: Conjunctivae normal.  Cardiovascular:     Rate and Rhythm: Normal rate and regular rhythm.     Heart sounds: No murmur heard. Pulmonary:     Effort: Pulmonary effort is normal. No respiratory distress.     Breath sounds: Normal breath sounds. No wheezing, rhonchi or rales.  Abdominal:     Palpations: Abdomen is soft.     Tenderness: There is abdominal tenderness in the right lower quadrant, periumbilical area and left lower quadrant. There is no guarding.  Musculoskeletal:        General: No swelling.     Cervical back: Neck supple.  Skin:    General: Skin is warm and dry.     Capillary Refill: Capillary refill takes less than 2 seconds.  Coloration: Skin is not jaundiced or pale.  Neurological:     Mental Status: She is alert.  Psychiatric:        Mood and Affect: Mood normal.     ED Results / Procedures / Treatments   Labs (all labs ordered are listed, but only abnormal results are displayed) Labs Reviewed  LIPASE, BLOOD - Abnormal; Notable for the following components:      Result Value   Lipase <10 (*)    All other components within normal limits  COMPREHENSIVE METABOLIC PANEL - Abnormal; Notable for the following components:   Sodium 133 (*)    Glucose, Bld 117 (*)    AST 11 (*)    All other components within normal limits  CBC - Abnormal; Notable for the following components:   WBC 12.9  (*)    RBC 3.67 (*)    Hemoglobin 11.3 (*)    HCT 33.7 (*)    All other components within normal limits  URINALYSIS, ROUTINE W REFLEX MICROSCOPIC - Abnormal; Notable for the following components:   Ketones, ur >80 (*)    Protein, ur TRACE (*)    Nitrite POSITIVE (*)    Bacteria, UA RARE (*)    All other components within normal limits  PREGNANCY, URINE - Abnormal; Notable for the following components:   Preg Test, Ur POSITIVE (*)    All other components within normal limits  URINE CULTURE  HCG, QUANTITATIVE, PREGNANCY  HCG, QUANTITATIVE, PREGNANCY  TYPE AND SCREEN    EKG None  Radiology US OB LESS THAN 14 WEEKS WITH OB TRANSVAGINAL  Result Date: 12/24/2022 CLINICAL DATA:  Right lower quadrant pain during early pregnancy. Previous history of ectopic pregnancy. Estimated gestational age by LMP is 4 weeks 6 days. Quantitative beta HCG is in process is not available at the time of dictation. EXAM: OBSTETRIC <14 WK [place] AND TRANSVAGINAL OB US TECHNIQUE: Both transabdominal and transvaginal ultrasound examinations were performed for complete evaluation of the gestation as well as the maternal uterus, adnexal regions, and pelvic cul-de-sac. Transvaginal technique was performed to assess early pregnancy. COMPARISON:  None Available. FINDINGS: Intrauterine gestational sac: None Yolk sac:  Not Visualized. Embryo:  Not Visualized. Cardiac Activity: Not Visualized. Maternal uterus/adnexae: Uterus is mildly retroverted. No myometrial mass lesions identified. Normal endometrium with stripe thickness of 7 mm. No endometrial fluid. There is a small amount of fluid in the endocervical canal. In the right adnexum, the ovary is not specifically identified. There is a hypovascular rounded area of soft tissue measuring 3.7 x 3 x 3.1 cm for a volume of 18 mL. The left ovary measures 5.8 x 4.9 x 5 cm for a volume of 73 mL. A simple appearing left ovarian cyst is present measuring 2.8 cm maximal diameter. A complex,  possibly hemorrhagic cyst is demonstrated measuring 2.9 cm diameter. Flow is demonstrated in the left ovary on color flow Doppler imaging. Moderate free fluid is present. Internal echoes suggest complex or hemorrhagic fluid. IMPRESSION: 1. No intrauterine pregnancy is identified. 2. Hypovascular irregular soft tissue demonstrated in the right adnexum. The ovary itself is not well defined. Moderate hemorrhagic free fluid in the pelvis. This likely indicates ectopic pregnancy. Critical Value/emergent results were called by telephone at the time of interpretation on 12/24/2022 at 6:20 pm to provider [facility] , who verbally acknowledged these results. Electronically Signed   By: Burman Nieves M.D.   On: 12/24/2022 18:21    Procedures .Critical Care  Performed by: Derrek Gu  M, PA-C Authorized by: Michelle Piper, PA-C   Critical care provider statement:    Critical care time (minutes):  45   Critical care was necessary to treat or prevent imminent or life-threatening deterioration of the following conditions: ectopic pregnancy.   Critical care was time spent personally by me on the following activities:  Development of treatment plan with patient or surrogate, discussions with consultants, evaluation of patient's response to treatment, examination of patient, ordering and review of laboratory studies, ordering and review of radiographic studies, ordering and performing treatments and interventions, pulse oximetry, re-evaluation of patient's condition and review of old charts   Care discussed with: admitting provider   Comments:     Likely ectopic pregnancy, emergent transfer for surgical intervention     Medications Ordered in ED Medications  sodium chloride 0.9 % bolus 1,000 mL (1,000 mLs Intravenous New Bag/Given 12/24/22 1809)  fentaNYL (SUBLIMAZE) injection 50 mcg (50 mcg Intravenous Given 12/24/22 1807)  cefTRIAXone (ROCEPHIN) 1 g in sodium chloride 0.9 % 100 mL IVPB (1 g  Intravenous New Bag/Given 12/24/22 1811)  ondansetron (ZOFRAN) injection 4 mg (4 mg Intravenous Given 12/24/22 1843)    ED Course/ Medical Decision Making/ A&P                             Medical Decision Making Amount and/or Complexity of Data Reviewed Labs: ordered. Radiology: ordered.  Risk Prescription drug management.  This patient presents to the ED for concern of abdominal pain which is worse in the left lower quadrant, this involves an extensive number of treatment options, and is a complaint that carries with it a high risk of complications and morbidity.  The differential diagnosis includes The differential diagnosis for generalized abdominal pain includes, but is not limited to AAA, gastroenteritis, appendicitis, Bowel obstruction, Bowel perforation. Gastroparesis, DKA, Hernia, Inflammatory bowel disease, mesenteric ischemia, pancreatitis, peritonitis SBP, volvulus.   Co morbidities that complicate the patient evaluation  Bipolar, schizophrenia  My initial workup includes labs, fluids, pain control, CT abdomen pelvis  Additional history obtained from: Nursing notes from this visit.  I ordered, reviewed and interpreted labs which include: CBC, CMP, lipase, urinalysis, urine pregnancy.  UPT positive.  Urinalysis nitrite positive with rare bacteria.  CMP with hyponatremia of 133 and hyperglycemia of 117.  CBC with leukocytosis of 12.9 and stable anemia with hemoglobin of 11.3.  I ordered imaging studies including OB ultrasound I independently visualized and interpreted imaging which showed moderate amount of free fluid in the abdomen and irregular shaped soft tissue mass in the right adnexa I agree with the radiologist interpretation  Consultations Obtained:  I requested consultation with the MA[facility] Dr. Alvester Morin and Dr. Macon Large ,  and discussed lab and imaging findings as well as pertinent plan - they recommend: Transfer to [facility] emergently  Afebrile, hemodynamically  stable.  32 year old female presenting to the ED for evaluation of sudden onset lower abdominal pain which is worse in the left.  On exam, she has an exquisitely tender abdomen.  Does have history of ectopic pregnancy.  Was found to have a positive UPT and emergent ultrasound was ordered.  This did show possible ectopic versus other abnormal mass in the right adnexa with free fluid in the pelvis.  Gynecology was consulted and recommends above.  Patient was emergently transferred to [facility] for evaluation and possible surgical intervention.  Her urinalysis did show signs of infection.  Rocephin was  given in the ED.  Patient is in agreement with this plan.  Stable at the time of transfer.  Patient's case discussed with Dr. Karene Fry who agrees with plan to discharge with follow-up.   Note: Portions of this report may have been transcribed using voice recognition software. Every effort was made to ensure accuracy; however, inadvertent computerized transcription errors may still be present.        Final Clinical Impression(s) / ED Diagnoses Final diagnoses:  Lower abdominal pain    Rx / DC Orders ED Discharge Orders     None         Mora Bellman 12/24/22 1901    Ernie Avena, MD 12/24/22 1918    Ernie Avena, MD 12/24/22 2212    Ernie Avena, MD 12/25/22 [phone]

## 2022-12-24 NOTE — ED Triage Notes (Signed)
Patient here POV from Home.  Endorse ABD Pain that began this AM. No N/V/D. No Known Fevers. Originally to Left Flank and has begun to radiate to Right ABD now.   Was at another Hospital but LWBS due to Wait.  NAD Noted during Triage. A&Ox4. GCS 15. Ambulatory

## 2022-12-24 NOTE — ED Notes (Signed)
[facility] at [facility] will sent transport for [facility] ED to [facility] ED. Dr Donnald Garre is accepting.-ABB(NS)

## 2022-12-24 NOTE — Anesthesia Preprocedure Evaluation (Addendum)
Anesthesia Evaluation  Patient identified by MRN, date of birth, ID band Patient awake    Reviewed: Allergy & Precautions, NPO status , Patient's Chart, lab work & pertinent test results  Airway Mallampati: I  TM Distance: >3 FB Neck ROM: Full    Dental  (+) Teeth Intact, Dental Advisory Given   Pulmonary Current Smoker   breath sounds clear to auscultation       Cardiovascular  Rhythm:Regular Rate:Normal     Neuro/Psych    GI/Hepatic   Endo/Other    Renal/GU      Musculoskeletal   Abdominal   Peds  Hematology   Anesthesia Other Findings   Reproductive/Obstetrics                             Anesthesia Physical Anesthesia Plan  ASA: 2 and emergent  Anesthesia Plan: General   Post-op Pain Management: Tylenol PO (pre-op)* and Toradol IV (intra-op)*   Induction: Intravenous  PONV Risk Score and Plan: 3 and Ondansetron, Dexamethasone, Midazolam and Scopolamine patch - Pre-op  Airway Management Planned: Oral ETT  Additional Equipment: None  Intra-op Plan:   Post-operative Plan: Extubation in OR  Informed Consent: I have reviewed the patients History and Physical, chart, labs and discussed the procedure including the risks, benefits and alternatives for the proposed anesthesia with the patient or authorized representative who has indicated his/her understanding and acceptance.     Dental advisory given  Plan Discussed with: CRNA  Anesthesia Plan Comments:        Anesthesia Quick Evaluation

## 2022-12-24 NOTE — Anesthesia Procedure Notes (Signed)
Procedure Name: Intubation Date/Time: 12/24/2022 10:57 PM  Performed by: Lelon Perla, CRNAPre-anesthesia Checklist: Patient identified, Emergency Drugs available, Suction available and Patient being monitored Patient Re-evaluated:Patient Re-evaluated prior to induction Oxygen Delivery Method: Circle system utilized Preoxygenation: Pre-oxygenation with 100% oxygen Induction Type: IV induction Ventilation: Mask ventilation without difficulty Laryngoscope Size: Mac and 3 Grade View: Grade I Tube type: Oral Tube size: 7.0 mm Number of attempts: 1 Airway Equipment and Method: Stylet and Oral airway Placement Confirmation: ETT inserted through vocal cords under direct vision, positive ETCO2 and breath sounds checked- equal and bilateral Secured at: 21 cm Tube secured with: Tape Dental Injury: Teeth and Oropharynx as per pre-operative assessment

## 2022-12-24 NOTE — MAU Provider Note (Signed)
Faculty Practice OB/GYN Attending MAU Note  Chief Complaint: Abdominal Pain    Event Date/Time   First Provider Initiated Contact with Patient 12/24/22 1929      SUBJECTIVE Denise Clark is a 32 y.o. G2P1011 at who presents with left sided abdominal and back pain.  She was transferred from [facility] ER where she was evaluated for severe left abdominal pain radiating to her side and back.   Patient reported history of previous ectopic pregnancy, had laparoscopic management in 2012 for ruptured right sided ectopic pregnancy.   In the ER, she had a positive UPT and UA concerning for UTI.  Bedside scan showed free fluid in the peritoneal cavity, this was confirmed on formal scan which also showed possible right sided adnexal mass.   However, her serum HCG was negative.  There was concern about possible  erroneous HCG draw, this was rechecked. Also concern about possible ruptured hemorrhagic cyst.  The patient was transferred to [facility] for further management .  On arrival here, patient reports moderate left abdominal and side pain.  Denies any abnormal vaginal discharge, fevers, chills, sweats, dysuria, nausea, vomiting, other GI or GU symptoms or other general symptoms.  No significant preoperative concerns.   OB History  Gravida Para Term Preterm AB Living  SAB IAB Ectopic Multiple Live Births      1   1    # Outcome Date GA Lbr Len/2nd Weight Sex Delivery Anes PTL Lv  2 Term 10/29/12    M CS-LTranv   LIV  1 Ectopic            Past Surgical History:  Procedure Laterality Date   CESAREAN SECTION     ECTOPIC PREGNANCY SURGERY     Social History   Socioeconomic History   Marital status: Single    Spouse name: Not on file   Number of children: Not on file   Years of education: Not on file   Highest education level: Not on file  Occupational History   Not on file  Tobacco Use   Smoking status: Some Days    Packs/day: .05    Types: Cigars, Cigarettes   Smokeless tobacco:  Never  Vaping Use   Vaping Use: Never used  Substance and Sexual Activity   Alcohol use: No   Drug use: No   Sexual activity: Yes    Birth control/protection: None  Other Topics Concern   Not on file  Social History Narrative   Not on file   Social Determinants of Health   Financial Resource Strain: Not on file  Food Insecurity: Not on file  Transportation Needs: Not on file  Physical Activity: Not on file  Stress: Not on file  Social Connections: Not on file  Intimate Partner Violence: Not on file   No current facility-administered medications on file prior to encounter.   Current Outpatient Medications on File Prior to Encounter  Medication Sig Dispense Refill   ARIPiprazole ER (ABILIFY MAINTENA) 400 MG SRER injection Inject 2 mLs (400 mg total) into the muscle every 28 (twenty-eight) days. Due 12/17 (Patient not taking: Reported on 12/24/2022) 1 each 11   etonogestrel (IMPLANON) 68 MG IMPL implant 1 each by Subdermal route once. (Patient not taking: Reported on 12/24/2022)     loperamide (IMODIUM) 2 MG capsule Take 1 capsule (2 mg total) by mouth 4 (four) times daily as needed for diarrhea or loose stools. (Patient not taking: Reported on 12/24/2022)  12 capsule 0   nitrofurantoin, macrocrystal-monohydrate, (MACROBID) 100 MG capsule Take 1 capsule (100 mg total) by mouth 2 (two) times daily. (Patient not taking: Reported on 12/24/2022) 4 capsule 0   OLANZapine (ZYPREXA) 15 MG tablet Take 1 tablet (15 mg total) by mouth at bedtime. (Patient not taking: Reported on 12/24/2022) 90 tablet 1   promethazine (PHENERGAN) 25 MG tablet Take 1 tablet (25 mg total) by mouth every 6 (six) hours as needed for nausea or vomiting. (Patient not taking: Reported on 12/24/2022) 30 tablet 0   No Known Allergies  ROS: Pertinent items in HPI  OBJECTIVE BP (!) 147/79   Pulse 95   Temp 98.9 F (37.2 C)   Resp 17   Ht 5\' 4"  (1.626 m)   Wt 95.3 kg   LMP 11/20/2022 (Exact Date)   SpO2 100%   BMI  36.05 kg/m  CONSTITUTIONAL: Well-developed, well-nourished female in no acute distress.  HENT:  Normocephalic, atraumatic, External right and left ear normal. Oropharynx is clear and moist EYES: Conjunctivae and EOM are normal. Pupils are equal, round, and reactive to light. No scleral icterus.  NECK: Normal range of motion, supple, no masses.  Normal thyroid.  SKIN: Skin is warm and dry. No rash noted. Not diaphoretic. No erythema. No pallor. NEUROLGIC: Alert and oriented to person, place, and time. Normal reflexes, muscle tone coordination. No cranial nerve deficit noted. PSYCHIATRIC: Normal mood and affect. Normal behavior. Normal judgment and thought content. CARDIOVASCULAR: Normal heart rate noted RESPIRATORY: Effort and breath sounds normal, no problems with respiration noted. ABDOMEN: Soft, normal bowel sounds, no distention noted.  Moderate suprapubic and left lower abdominal tenderness on palpation. No rebound or guarding. BACK: +Left CVAT PELVIC: Deferred. MUSCULOSKELETAL: Normal range of motion. No tenderness.  No cyanosis, clubbing, or edema.  2+ distal pulses.  LAB RESULTS Results for orders placed or performed during the hospital encounter of 12/24/22 (from the past 48 hour(s))  Lipase, blood     Status: Abnormal   Collection Time: 12/24/22  4:29 PM  Result Value Ref Range   Lipase <10 (L) 11 - 51 U/L    Comment: Performed at [facility], [address], [place], [place] [address]  Comprehensive metabolic panel     Status: Abnormal   Collection Time: 12/24/22  4:29 PM  Result Value Ref Range   Sodium 133 (L) 135 - 145 mmol/L   Potassium 3.6 3.5 - 5.1 mmol/L   Chloride 103 98 - 111 mmol/L   CO2 22 22 - 32 mmol/L   Glucose, Bld 117 (H) 70 - 99 mg/dL    Comment: Glucose reference range applies only to samples taken after fasting for at least 8 hours.   BUN 6 6 - 20 mg/dL   Creatinine, Ser 6.04 0.44 - 1.00 mg/dL   Calcium 9.4 8.9 - 54.0 mg/dL   Total  Protein 7.5 6.5 - 8.1 g/dL   Albumin 4.4 3.5 - 5.0 g/dL   AST 11 (L) 15 - 41 U/L   ALT 10 0 - 44 U/L   Alkaline Phosphatase 52 38 - 126 U/L   Total Bilirubin 0.4 0.3 - 1.2 mg/dL   GFR, Estimated >98 >11 mL/min    Comment: (NOTE) Calculated using the CKD-EPI Creatinine Equation (2021)    Anion gap 8 5 - 15    Comment: Performed at [facility], [address], [place], [place] [address]  CBC     Status: Abnormal   Collection Time: 12/24/22  4:29 PM  Result Value Ref Range   WBC 12.9 (H) 4.0 - 10.5 K/uL   RBC 3.67 (L) 3.87 - 5.11 MIL/uL   Hemoglobin 11.3 (L) 12.0 - 15.0 g/dL   HCT 16.1 (L) 09.6 - 04.5 %   MCV 91.8 80.0 - 100.0 fL   MCH 30.8 26.0 - 34.0 pg   MCHC 33.5 30.0 - 36.0 g/dL   RDW 40.9 81.1 - 91.4 %   Platelets 375 150 - 400 K/uL   nRBC 0.0 0.0 - 0.2 %    Comment: Performed at [facility], [address], [place], [place] [address]  Urinalysis, Routine w reflex microscopic -Urine, Clean Catch     Status: Abnormal   Collection Time: 12/24/22  4:29 PM  Result Value Ref Range   Color, Urine YELLOW YELLOW   APPearance CLEAR CLEAR   Specific Gravity, Urine 1.024 1.005 - 1.030   pH 6.0 5.0 - 8.0   Glucose, UA NEGATIVE NEGATIVE mg/dL   Hgb urine dipstick NEGATIVE NEGATIVE   Bilirubin Urine NEGATIVE NEGATIVE   Ketones, ur >80 (A) NEGATIVE mg/dL   Protein, ur TRACE (A) NEGATIVE mg/dL   Nitrite POSITIVE (A) NEGATIVE   Leukocytes,Ua NEGATIVE NEGATIVE   RBC / HPF 0-5 0 - 5 RBC/hpf   WBC, UA 0-5 0 - 5 WBC/hpf   Bacteria, UA RARE (A) NONE SEEN   Squamous Epithelial / HPF 0-5 0 - 5 /HPF   Mucus PRESENT     Comment: Performed at [facility], [address], [place], [place] [address]  Pregnancy, urine     Status: Abnormal   Collection Time: 12/24/22  4:29 PM  Result Value Ref Range   Preg Test, Ur POSITIVE (A) NEGATIVE    Comment: Performed at [facility], [address] [place], [place]  [address]  hCG, quantitative, pregnancy     Status: None   Collection Time: 12/24/22  4:29 PM  Result Value Ref Range   hCG, Beta Chain, Quant, S <1 <5 mIU/mL    Comment:          GEST. AGE      CONC.  (mIU/mL)   <=1 WEEK        5 - 50     2 WEEKS       50 - 500     3 WEEKS       100 - 10,000     4 WEEKS     1,000 - 30,000     5 WEEKS     3,500 - 115,000   6-8 WEEKS     12,000 - 270,000    12 WEEKS     15,000 - 220,000        FEMALE AND NON-PREGNANT FEMALE:     LESS THAN 5 mIU/mL REPEATED TO VERIFY Performed at [facility], [address] [place], [place] [address]   hCG, quantitative, pregnancy     Status: Abnormal   Collection Time: 12/24/22  6:43 PM  Result Value Ref Range   hCG, Beta Chain, Quant, S 9,204 (H) <5 mIU/mL    Comment:          GEST. AGE      CONC.  (mIU/mL)   <=1 WEEK        5 - 50     2 WEEKS       50 - 500     3 WEEKS       100 - 10,000  4 WEEKS     1,000 - 30,000     5 WEEKS     3,500 - 115,000   6-8 WEEKS     12,000 - 270,000    12 WEEKS     15,000 - 220,000        FEMALE AND NON-PREGNANT FEMALE:     LESS THAN 5 mIU/mL RESULT CONFIRMED BY AUTOMATED DILUTION Performed at [facility], [address], [place], [place] [address]   Type and screen [facility]     Status: None   Collection Time: 12/24/22  7:30 PM  Result Value Ref Range   ABO/RH(D) O POS    Antibody Screen NEG    Sample Expiration      12/27/2022,2359 Performed at [facility], [address] [place], [place] [address]   CBC     Status: Abnormal   Collection Time: 12/24/22  7:33 PM  Result Value Ref Range   WBC 13.8 (H) 4.0 - 10.5 K/uL   RBC 3.21 (L) 3.87 - 5.11 MIL/uL   Hemoglobin 9.8 (L) 12.0 - 15.0 g/dL   HCT 09.8 (L) 11.9 - 14.7 %   MCV 94.1 80.0 - 100.0 fL   MCH 30.5 26.0 - 34.0 pg   MCHC 32.5 30.0 - 36.0 g/dL   RDW 82.9 56.2 - 13.0 %   Platelets 340 150 - 400 K/uL   nRBC 0.0 0.0 - 0.2 %    Comment: Performed  at [facility], [address], [place], [place] [address]    IMAGING US OB LESS THAN 14 WEEKS WITH OB TRANSVAGINAL  Result Date: 12/24/2022 CLINICAL DATA:  Right lower quadrant pain during early pregnancy. Previous history of ectopic pregnancy. Estimated gestational age by LMP is 4 weeks 6 days. Quantitative beta HCG is in process is not available at the time of dictation. EXAM: OBSTETRIC <14 WK [place] AND TRANSVAGINAL OB US TECHNIQUE: Both transabdominal and transvaginal ultrasound examinations were performed for complete evaluation of the gestation as well as the maternal uterus, adnexal regions, and pelvic cul-de-sac. Transvaginal technique was performed to assess early pregnancy. COMPARISON:  None Available. FINDINGS: Intrauterine gestational sac: None Yolk sac:  Not Visualized. Embryo:  Not Visualized. Cardiac Activity: Not Visualized. Maternal uterus/adnexae: Uterus is mildly retroverted. No myometrial mass lesions identified. Normal endometrium with stripe thickness of 7 mm. No endometrial fluid. There is a small amount of fluid in the endocervical canal. In the right adnexum, the ovary is not specifically identified. There is a hypovascular rounded area of soft tissue measuring 3.7 x 3 x 3.1 cm for a volume of 18 mL. The left ovary measures 5.8 x 4.9 x 5 cm for a volume of 73 mL. A simple appearing left ovarian cyst is present measuring 2.8 cm maximal diameter. A complex, possibly hemorrhagic cyst is demonstrated measuring 2.9 cm diameter. Flow is demonstrated in the left ovary on color flow Doppler imaging. Moderate free fluid is present. Internal echoes suggest complex or hemorrhagic fluid. IMPRESSION: 1. No intrauterine pregnancy is identified. 2. Hypovascular irregular soft tissue demonstrated in the right adnexum. The ovary itself is not well defined. Moderate hemorrhagic free fluid in the pelvis. This likely indicates ectopic pregnancy. Critical Value/emergent results were called by telephone  at the time of interpretation on 12/24/2022 at 6:20 pm to provider [facility] , who verbally acknowledged these results. Electronically Signed   By: Burman Nieves M.D.   On: 12/24/2022 18:21    MAU  COURSE Patient already received Rocephin 1g IV x 1 at [facility] ER, another 1 g dose ordered here. Awaiting serum HCG re-draw results. Also drew CBC and Type and Screen. Will keep NPO for now. No peritoneal signs.  8:27 PM accepted care from Dr. Jaynie Collins  8:45 PM Patient with increased pain. TTP on LLQ. Givne additional dose of fentanyl  9:45 PM called Main Lab, no results for BHCG. They gave me [facility] Lab number. I called and spoke to them. They informed me the results are now resulted.   9:45 Stat BHCG was 9204, called Dr. Charlotta Seanna Sisler immediately about the elevated BHCG. She came to bedside to consent patient for emergency surgery.  See HP a  ASSESSMENT 1. Ruptured ectopic pregnancy   2. Lower abdominal pain   3. Pyelonephritis     PLAN To OR   Federico Flake, MD 12/24/2022 10:37 PM

## 2022-12-24 NOTE — H&P (Addendum)
Faculty Practice Obstetrics and Gynecology Attending History and Physical  Denise Clark is a 32 y.o. G2P1011 at Unknown [place] who presents for pelvic pain that radiates to her back.  Pt transferred from [facility] ER due to concern for possible ruptured ectopic.  On arrival here, hcg was <1.  Repeat HCG was >9000 and confirms suspicion of ruptured.  At bedside, pt continues to note consistent left-sided pain that has gotten progressively worse.    Past Medical History:  Diagnosis Date   Bipolar disorder    Ectopic pregnancy    Schizophrenia    Past Surgical History:  Procedure Laterality Date   CESAREAN SECTION     ECTOPIC PREGNANCY SURGERY     OB History  Gravida Para Term Preterm AB Living  2 1 1   1 1   SAB IAB Ectopic Multiple Live Births      1   1    # Outcome Date GA Lbr Len/2nd Weight Sex Delivery Anes PTL Lv  2 Term 10/29/12    M CS-LTranv   LIV  1 Ectopic           Patient denies any other pertinent gynecologic issues.  No current facility-administered medications on file prior to encounter.   Current Outpatient Medications on File Prior to Encounter  Medication Sig Dispense Refill   ARIPiprazole ER (ABILIFY MAINTENA) 400 MG SRER injection Inject 2 mLs (400 mg total) into the muscle every 28 (twenty-eight) days. Due 12/17 (Patient not taking: Reported on 12/24/2022) 1 each 11   etonogestrel (IMPLANON) 68 MG IMPL implant 1 each by Subdermal route once. (Patient not taking: Reported on 12/24/2022)     loperamide (IMODIUM) 2 MG capsule Take 1 capsule (2 mg total) by mouth 4 (four) times daily as needed for diarrhea or loose stools. (Patient not taking: Reported on 12/24/2022) 12 capsule 0   nitrofurantoin, macrocrystal-monohydrate, (MACROBID) 100 MG capsule Take 1 capsule (100 mg total) by mouth 2 (two) times daily. (Patient not taking: Reported on 12/24/2022) 4 capsule 0   OLANZapine (ZYPREXA) 15 MG tablet Take 1 tablet (15 mg total) by mouth at bedtime. (Patient not taking:  Reported on 12/24/2022) 90 tablet 1   promethazine (PHENERGAN) 25 MG tablet Take 1 tablet (25 mg total) by mouth every 6 (six) hours as needed for nausea or vomiting. (Patient not taking: Reported on 12/24/2022) 30 tablet 0   No Known Allergies  Social History:   reports that she has been smoking cigars and cigarettes. She has been smoking an average of .05 packs per day. She has never used smokeless tobacco. She reports that she does not drink alcohol and does not use drugs. History reviewed. No pertinent family history.  Review of Systems: Pertinent items noted in HPI and remainder of comprehensive ROS otherwise negative.  PHYSICAL EXAM: Blood pressure 118/64, pulse 75, temperature 98.9 F (37.2 C), resp. rate 17, height 5\' 4"  (1.626 m), weight 95.3 kg, last menstrual period 11/20/2022, SpO2 100 %. CONSTITUTIONAL: Well-developed, well-nourished female in no acute distress.  SKIN: Skin is warm and dry. No rash noted. Not diaphoretic. No erythema. No pallor. NEUROLOGIC: Alert and oriented to person, place, and time. Normal reflexes, muscle tone coordination. No cranial nerve deficit noted. PSYCHIATRIC: Normal mood and affect. Normal behavior. Normal judgment and thought content. CARDIOVASCULAR: Normal heart rate noted, regular rhythm RESPIRATORY: Effort and breath sounds normal, no problems with respiration noted ABDOMEN: Significant LLQ pain noted.  Some rebound, no guarding. PELVIC: deferred MUSCULOSKELETAL: no calf tenderness  bilaterally EXT: no edema bilaterally, normal pulses  Labs: Results for orders placed or performed during the hospital encounter of 12/24/22 (from the past 336 hour(s))  Lipase, blood   Collection Time: 12/24/22  4:29 PM  Result Value Ref Range   Lipase <10 (L) 11 - 51 U/L  Comprehensive metabolic panel   Collection Time: 12/24/22  4:29 PM  Result Value Ref Range   Sodium 133 (L) 135 - 145 mmol/L   Potassium 3.6 3.5 - 5.1 mmol/L   Chloride 103 98 - 111 mmol/L    CO2 22 22 - 32 mmol/L   Glucose, Bld 117 (H) 70 - 99 mg/dL   BUN 6 6 - 20 mg/dL   Creatinine, Ser 4.09 0.44 - 1.00 mg/dL   Calcium 9.4 8.9 - 81.1 mg/dL   Total Protein 7.5 6.5 - 8.1 g/dL   Albumin 4.4 3.5 - 5.0 g/dL   AST 11 (L) 15 - 41 U/L   ALT 10 0 - 44 U/L   Alkaline Phosphatase 52 38 - 126 U/L   Total Bilirubin 0.4 0.3 - 1.2 mg/dL   GFR, Estimated >91 >47 mL/min   Anion gap 8 5 - 15  CBC   Collection Time: 12/24/22  4:29 PM  Result Value Ref Range   WBC 12.9 (H) 4.0 - 10.5 K/uL   RBC 3.67 (L) 3.87 - 5.11 MIL/uL   Hemoglobin 11.3 (L) 12.0 - 15.0 g/dL   HCT 82.9 (L) 56.2 - 13.0 %   MCV 91.8 80.0 - 100.0 fL   MCH 30.8 26.0 - 34.0 pg   MCHC 33.5 30.0 - 36.0 g/dL   RDW 86.5 78.4 - 69.6 %   Platelets 375 150 - 400 K/uL   nRBC 0.0 0.0 - 0.2 %  Urinalysis, Routine w reflex microscopic -Urine, Clean Catch   Collection Time: 12/24/22  4:29 PM  Result Value Ref Range   Color, Urine YELLOW YELLOW   APPearance CLEAR CLEAR   Specific Gravity, Urine 1.024 1.005 - 1.030   pH 6.0 5.0 - 8.0   Glucose, UA NEGATIVE NEGATIVE mg/dL   Hgb urine dipstick NEGATIVE NEGATIVE   Bilirubin Urine NEGATIVE NEGATIVE   Ketones, ur >80 (A) NEGATIVE mg/dL   Protein, ur TRACE (A) NEGATIVE mg/dL   Nitrite POSITIVE (A) NEGATIVE   Leukocytes,Ua NEGATIVE NEGATIVE   RBC / HPF 0-5 0 - 5 RBC/hpf   WBC, UA 0-5 0 - 5 WBC/hpf   Bacteria, UA RARE (A) NONE SEEN   Squamous Epithelial / HPF 0-5 0 - 5 /HPF   Mucus PRESENT   Pregnancy, urine   Collection Time: 12/24/22  4:29 PM  Result Value Ref Range   Preg Test, Ur POSITIVE (A) NEGATIVE  hCG, quantitative, pregnancy   Collection Time: 12/24/22  4:29 PM  Result Value Ref Range   hCG, Beta Chain, Quant, S <1 <5 mIU/mL  hCG, quantitative, pregnancy   Collection Time: 12/24/22  6:43 PM  Result Value Ref Range   hCG, Beta Chain, Quant, S 9,204 (H) <5 mIU/mL  Type and screen [facility]   Collection Time: 12/24/22  7:30 PM  Result Value  Ref Range   ABO/RH(D) O POS    Antibody Screen NEG    Sample Expiration      12/27/2022,2359 Performed at [facility], [address] [place], [place] [address]   CBC   Collection Time: 12/24/22  7:33 PM  Result Value Ref Range   WBC 13.8 (H) 4.0 -  10.5 K/uL   RBC 3.21 (L) 3.87 - 5.11 MIL/uL   Hemoglobin 9.8 (L) 12.0 - 15.0 g/dL   HCT 40.9 (L) 81.1 - 91.4 %   MCV 94.1 80.0 - 100.0 fL   MCH 30.5 26.0 - 34.0 pg   MCHC 32.5 30.0 - 36.0 g/dL   RDW 78.2 95.6 - 21.3 %   Platelets 340 150 - 400 K/uL   nRBC 0.0 0.0 - 0.2 %    Imaging Studies: US OB LESS THAN 14 WEEKS WITH OB TRANSVAGINAL  Result Date: 12/24/2022 CLINICAL DATA:  Right lower quadrant pain during early pregnancy. Previous history of ectopic pregnancy. Estimated gestational age by LMP is 4 weeks 6 days. Quantitative beta HCG is in process is not available at the time of dictation. EXAM: OBSTETRIC <14 WK [place] AND TRANSVAGINAL OB US TECHNIQUE: Both transabdominal and transvaginal ultrasound examinations were performed for complete evaluation of the gestation as well as the maternal uterus, adnexal regions, and pelvic cul-de-sac. Transvaginal technique was performed to assess early pregnancy. COMPARISON:  None Available. FINDINGS: Intrauterine gestational sac: None Yolk sac:  Not Visualized. Embryo:  Not Visualized. Cardiac Activity: Not Visualized. Maternal uterus/adnexae: Uterus is mildly retroverted. No myometrial mass lesions identified. Normal endometrium with stripe thickness of 7 mm. No endometrial fluid. There is a small amount of fluid in the endocervical canal. In the right adnexum, the ovary is not specifically identified. There is a hypovascular rounded area of soft tissue measuring 3.7 x 3 x 3.1 cm for a volume of 18 mL. The left ovary measures 5.8 x 4.9 x 5 cm for a volume of 73 mL. A simple appearing left ovarian cyst is present measuring 2.8 cm maximal diameter. A complex, possibly hemorrhagic cyst is demonstrated  measuring 2.9 cm diameter. Flow is demonstrated in the left ovary on color flow Doppler imaging. Moderate free fluid is present. Internal echoes suggest complex or hemorrhagic fluid. IMPRESSION: 1. No intrauterine pregnancy is identified. 2. Hypovascular irregular soft tissue demonstrated in the right adnexum. The ovary itself is not well defined. Moderate hemorrhagic free fluid in the pelvis. This likely indicates ectopic pregnancy. Critical Value/emergent results were called by telephone at the time of interpretation on 12/24/2022 at 6:20 pm to provider [facility] , who verbally acknowledged these results. Electronically Signed   By: Burman Nieves M.D.   On: 12/24/2022 18:21    Assessment: Principal Problem:   Ruptured ectopic pregnancy Active Problems:   UTI (urinary tract infection)   Plan: Discussed HCG findings and pelvic US.  Recommend proceeding to OR for management ruptured ectopic- diagnostic laparoscopy, removal of ectopic, unilateral salpingectomy. -IV Rocephin due to UTI -NPO -LR @ 125cc/hr -SCDs to OR -Risk/benefits and alternatives reviewed with the patient including but not limited to risk of bleeding, infection and injury to surrounding organs.  Also discussed that if prior fallopian tube was already removed, if today required removal of her 2nd tube this would mean surgical sterilization.  Pt understands and wishes to proceed with surgical intervention. Questions and concerns were addressed and pt desires to proceed.  OR notified.  Myna Hidalgo, DO Attending Obstetrician & Gynecologist, [facility] for [employer], [facility]

## 2022-12-24 NOTE — ED Provider Triage Note (Signed)
Emergency Medicine Provider Triage Evaluation Note  Denise Clark , a 32 y.o. female  was evaluated in triage.  Pt complains of abdominal pain, generalized, onset this morning waking her from her sleep. Constant, worse with any movement. Not assocaited with nausea, vomiting, changes in bowel or bladder habits. Unknown if pregnant. Prior abdominal surgery for ectopic pregnancy   Review of Systems  Positive:  Negative:   Physical Exam  BP (!) 148/89 (BP Location: Right Arm)   Pulse 92   Temp 98.2 F (36.8 C)   Resp 18   SpO2 100%  Gen:   Awake, no distress   Resp:  Normal effort  MSK:   Moves extremities without difficulty  Other:  Diffuse abdominal pain, does not allow for deep palpation   Medical Decision Making  Medically screening exam initiated at 2:16 PM.  Appropriate orders placed.  Anselm Pancoast was informed that the remainder of the evaluation will be completed by another provider, this initial triage assessment does not replace that evaluation, and the importance of remaining in the ED until their evaluation is complete.     Jeannie Fend, PA-C 12/24/22 1423

## 2022-12-24 NOTE — MAU Note (Signed)
.  Denise Clark is a 32 y.o. at Unknown here in MAU reporting pain in lower abd more on L side. Also some pain in lower abd. Pt came by [facility] from [facility] for further eval of abd pain and ? Ectopic. Pain is more LLQ around to lower back. Labs drawn at [facility] and Dr Macon Large in to see pt and discuss plan of care. Pt has IV R AC with NS infusing at TKO with 400cc TC LMP: 11/20/22 Onset of complaint: 12n Pain score: 5 Vitals:   12/24/22 1815 12/24/22 1934  BP: 118/64   Pulse: 83 75  Resp: (!) 29 17  Temp:  98.9 F (37.2 C)  SpO2: 100% 100%     FHT:n/a Lab orders placed from triage:

## 2022-12-25 ENCOUNTER — Encounter (HOSPITAL_COMMUNITY): Payer: Self-pay | Admitting: Obstetrics & Gynecology

## 2022-12-25 ENCOUNTER — Other Ambulatory Visit (HOSPITAL_COMMUNITY): Payer: Self-pay

## 2022-12-25 ENCOUNTER — Other Ambulatory Visit: Payer: Self-pay

## 2022-12-25 DIAGNOSIS — Z3A00 Weeks of gestation of pregnancy not specified: Secondary | ICD-10-CM

## 2022-12-25 DIAGNOSIS — O00102 Left tubal pregnancy without intrauterine pregnancy: Secondary | ICD-10-CM | POA: Diagnosis not present

## 2022-12-25 LAB — COMPREHENSIVE METABOLIC PANEL
ALT: 11 U/L (ref 0–44)
AST: 13 U/L — ABNORMAL LOW (ref 15–41)
Albumin: 3.4 g/dL — ABNORMAL LOW (ref 3.5–5.0)
Alkaline Phosphatase: 42 U/L (ref 38–126)
Anion gap: 6 (ref 5–15)
BUN: 6 mg/dL (ref 6–20)
CO2: 22 mmol/L (ref 22–32)
Calcium: 8.1 mg/dL — ABNORMAL LOW (ref 8.9–10.3)
Chloride: 104 mmol/L (ref 98–111)
Creatinine, Ser: 0.75 mg/dL (ref 0.44–1.00)
GFR, Estimated: >60 mL/min (ref >60)
Glucose, Bld: 151 mg/dL — ABNORMAL HIGH (ref 70–99)
Potassium: 3.4 mmol/L — ABNORMAL LOW (ref 3.5–5.1)
Sodium: 132 mmol/L — ABNORMAL LOW (ref 135–145)
Total Bilirubin: 0.6 mg/dL (ref 0.3–1.2)
Total Protein: 6 g/dL — ABNORMAL LOW (ref 6.5–8.1)

## 2022-12-25 LAB — CBC
HCT: 23.7 % — ABNORMAL LOW (ref 36.0–46.0)
Hemoglobin: 7.6 g/dL — ABNORMAL LOW (ref 12.0–15.0)
MCH: 30 pg (ref 26.0–34.0)
MCHC: 32.1 g/dL (ref 30.0–36.0)
MCV: 93.7 fL (ref 80.0–100.0)
Platelets: 256 10*3/uL (ref 150–400)
RBC: 2.53 MIL/uL — ABNORMAL LOW (ref 3.87–5.11)
RDW: 14.3 % (ref 11.5–15.5)
WBC: 9.6 10*3/uL (ref 4.0–10.5)
nRBC: 0 % (ref 0.0–0.2)

## 2022-12-25 MED ORDER — PROMETHAZINE HCL 25 MG/ML IJ SOLN: 6.2500 mg | INTRAMUSCULAR | Status: DC | PRN

## 2022-12-25 MED ORDER — FENTANYL CITRATE (PF) 100 MCG/2ML IJ SOLN
INTRAMUSCULAR | Status: AC
  Filled 2022-12-25: qty 2

## 2022-12-25 MED ORDER — ACETAMINOPHEN 325 MG PO TABS: 325.0000 mg | ORAL_TABLET | ORAL | Status: DC | PRN

## 2022-12-25 MED ORDER — SUGAMMADEX SODIUM 200 MG/2ML IV SOLN
INTRAVENOUS | Status: DC | PRN
  Administered 2022-12-25: 300 mg via INTRAVENOUS

## 2022-12-25 MED ORDER — NITROFURANTOIN MONOHYD MACRO 100 MG PO CAPS: 100.0000 mg | ORAL_CAPSULE | Freq: Two times a day (BID) | ORAL | Status: DC

## 2022-12-25 MED ORDER — ACETAMINOPHEN 325 MG PO TABS: 650.0000 mg | ORAL_TABLET | Freq: Four times a day (QID) | ORAL | Status: AC | PRN

## 2022-12-25 MED ORDER — KETOROLAC TROMETHAMINE 30 MG/ML IJ SOLN
INTRAMUSCULAR | Status: AC
  Filled 2022-12-25: qty 1

## 2022-12-25 MED ORDER — OXYCODONE HCL 5 MG/5ML PO SOLN: 5.0000 mg | Freq: Once | ORAL | Status: DC | PRN

## 2022-12-25 MED ORDER — ONDANSETRON HCL 4 MG PO TABS: 4.0000 mg | ORAL_TABLET | Freq: Four times a day (QID) | ORAL | Status: DC | PRN

## 2022-12-25 MED ORDER — FENTANYL CITRATE (PF) 100 MCG/2ML IJ SOLN
25.0000 ug | INTRAMUSCULAR | Status: DC | PRN
  Administered 2022-12-25 (×2): 50 ug via INTRAVENOUS

## 2022-12-25 MED ORDER — ACETAMINOPHEN 10 MG/ML IV SOLN: 1000.0000 mg | Freq: Once | INTRAVENOUS | Status: DC | PRN

## 2022-12-25 MED ORDER — OXYCODONE HCL 5 MG PO TABS: 5.0000 mg | ORAL_TABLET | Freq: Once | ORAL | Status: DC | PRN

## 2022-12-25 MED ORDER — CEFADROXIL 500 MG PO CAPS
500.0000 mg | ORAL_CAPSULE | Freq: Two times a day (BID) | ORAL | 0 refills | Status: DC
  Filled 2022-12-25: qty 14, 7d supply, fill #0

## 2022-12-25 MED ORDER — HYDROCODONE-ACETAMINOPHEN 5-325 MG PO TABS
1.0000 | ORAL_TABLET | ORAL | Status: DC | PRN
  Administered 2022-12-25 (×2): 2 via ORAL
  Filled 2022-12-25 (×2): qty 2

## 2022-12-25 MED ORDER — ONDANSETRON HCL 4 MG PO TABS
4.0000 mg | ORAL_TABLET | Freq: Four times a day (QID) | ORAL | 0 refills | Status: DC | PRN
  Filled 2022-12-25: qty 20, 5d supply, fill #0

## 2022-12-25 MED ORDER — HYDROCODONE-ACETAMINOPHEN 5-325 MG PO TABS
1.0000 | ORAL_TABLET | Freq: Four times a day (QID) | ORAL | 0 refills | Status: DC | PRN
  Filled 2022-12-25: qty 24, 3d supply, fill #0

## 2022-12-25 MED ORDER — ACETAMINOPHEN 160 MG/5ML PO SOLN: 325.0000 mg | ORAL | Status: DC | PRN

## 2022-12-25 MED ORDER — METHOTREXATE FOR ECTOPIC PREGNANCY
50.0000 mg/m2 | Freq: Once | INTRAMUSCULAR | Status: AC
  Administered 2022-12-25: 102.5 mg via INTRAMUSCULAR
  Filled 2022-12-25: qty 4.1

## 2022-12-25 MED ORDER — ONDANSETRON HCL 4 MG/2ML IJ SOLN: 4.0000 mg | Freq: Four times a day (QID) | INTRAMUSCULAR | Status: DC | PRN

## 2022-12-25 MED ORDER — IBUPROFEN 600 MG PO TABS: 600.0000 mg | ORAL_TABLET | Freq: Four times a day (QID) | ORAL | Status: DC

## 2022-12-25 MED ORDER — MENTHOL 3 MG MT LOZG: 1.0000 | LOZENGE | OROMUCOSAL | Status: DC | PRN

## 2022-12-25 MED ORDER — DOCUSATE SODIUM 100 MG PO CAPS
100.0000 mg | ORAL_CAPSULE | Freq: Two times a day (BID) | ORAL | Status: DC
  Administered 2022-12-25: 100 mg via ORAL
  Filled 2022-12-25: qty 1

## 2022-12-25 MED ORDER — ACETAMINOPHEN 325 MG PO TABS
650.0000 mg | ORAL_TABLET | Freq: Four times a day (QID) | ORAL | Status: DC
  Administered 2022-12-25: 650 mg via ORAL
  Filled 2022-12-25: qty 2

## 2022-12-25 MED ORDER — SIMETHICONE 80 MG PO CHEW: 80.0000 mg | CHEWABLE_TABLET | Freq: Four times a day (QID) | ORAL | Status: DC | PRN

## 2022-12-25 MED ORDER — FERROUS GLUCONATE 324 (38 FE) MG PO TABS
324.0000 mg | ORAL_TABLET | ORAL | 0 refills | Status: AC
  Filled 2022-12-25: qty 30, 60d supply, fill #0

## 2022-12-25 MED ORDER — LACTATED RINGERS IV SOLN: INTRAVENOUS | Status: DC

## 2022-12-25 MED ORDER — AMISULPRIDE (ANTIEMETIC) 5 MG/2ML IV SOLN: 10.0000 mg | Freq: Once | INTRAVENOUS | Status: DC | PRN

## 2022-12-25 MED ORDER — FERROUS GLUCONATE 324 (38 FE) MG PO TABS
324.0000 mg | ORAL_TABLET | ORAL | Status: DC
  Administered 2022-12-25: 324 mg via ORAL
  Filled 2022-12-25: qty 1

## 2022-12-25 MED ORDER — KETOROLAC TROMETHAMINE 30 MG/ML IJ SOLN
INTRAMUSCULAR | Status: DC | PRN
  Administered 2022-12-25: 30 mg via INTRAVENOUS

## 2022-12-25 MED ORDER — IBUPROFEN 600 MG PO TABS
600.0000 mg | ORAL_TABLET | Freq: Four times a day (QID) | ORAL | 0 refills | Status: AC
  Filled 2022-12-25: qty 30, 8d supply, fill #0

## 2022-12-25 MED ORDER — KETOROLAC TROMETHAMINE 30 MG/ML IJ SOLN
30.0000 mg | Freq: Four times a day (QID) | INTRAMUSCULAR | Status: DC
  Administered 2022-12-25: 30 mg via INTRAVENOUS
  Filled 2022-12-25: qty 1

## 2022-12-25 MED ORDER — DOCUSATE SODIUM 100 MG PO CAPS
100.0000 mg | ORAL_CAPSULE | Freq: Two times a day (BID) | ORAL | 0 refills | Status: AC
  Filled 2022-12-25: qty 40, 20d supply, fill #0

## 2022-12-25 NOTE — Plan of Care (Signed)
  Problem: Education: Goal: Knowledge of General Education information will improve Description: Including pain rating scale, medication(s)/side effects and non-pharmacologic comfort measures Outcome: Adequate for Discharge   Problem: Health Behavior/Discharge Planning: Goal: Ability to manage health-related needs will improve Outcome: Adequate for Discharge   Problem: Clinical Measurements: Goal: Ability to maintain clinical measurements within normal limits will improve Outcome: Adequate for Discharge Goal: Will remain free from infection Outcome: Adequate for Discharge Goal: Diagnostic test results will improve Outcome: Adequate for Discharge Goal: Respiratory complications will improve Outcome: Adequate for Discharge Goal: Cardiovascular complication will be avoided Outcome: Adequate for Discharge   Problem: Activity: Goal: Risk for activity intolerance will decrease Outcome: Adequate for Discharge   Problem: Nutrition: Goal: Adequate nutrition will be maintained Outcome: Adequate for Discharge   Problem: Coping: Goal: Level of anxiety will decrease Outcome: Adequate for Discharge   Problem: Elimination: Goal: Will not experience complications related to bowel motility Outcome: Adequate for Discharge Goal: Will not experience complications related to urinary retention Outcome: Adequate for Discharge   Problem: Pain Managment: Goal: General experience of comfort will improve Outcome: Adequate for Discharge   Problem: Safety: Goal: Ability to remain free from injury will improve Outcome: Adequate for Discharge   Problem: Skin Integrity: Goal: Risk for impaired skin integrity will decrease Outcome: Adequate for Discharge   Problem: Education: Goal: Knowledge of the prescribed therapeutic regimen will improve Outcome: Adequate for Discharge Goal: Understanding of sexual limitations or changes related to disease process or condition will improve Outcome: Adequate  for Discharge Goal: Individualized Educational Video(s) Outcome: Adequate for Discharge   Problem: Self-Concept: Goal: Communication of feelings regarding changes in body function or appearance will improve Outcome: Adequate for Discharge   Problem: Skin Integrity: Goal: Demonstration of wound healing without infection will improve Outcome: Adequate for Discharge   

## 2022-12-25 NOTE — Op Note (Signed)
PREOPERATIVE DIAGNOSIS:  Ruptured ectopic pregnancy POSTOPERATIVE DIAGNOSIS: Intermural/Interstitial ectopic pregnancy PROCEDURE PERFORMED: Diagnostic laparoscopy, removal of ectopic, evacuation of hemoperitoneum SURGEON: Dr. Myna Hidalgo ANESTHESIA: General endotracheal.  ESTIMATED BLOOD LOSS: 1000cc.  URINE OUTPUT: 100cc of clear yellow urine at the end of the procedure.  IV FLUIDS: 1000cc of crystalloid and 250cc albumin SPECIMEN(S): 1) ectopic pregnancy COMPLICATIONS: None.  CONDITION: Stable.  FINDINGS: Large hemoperitoneum noted, ~ 1000cc.  Liver, gallbladder and bowel appeared grossly normal.  Similar to prior pregnancy in 2012 except located on the opposite side (previously on right), now on left.    Noted to have an interstitial pregnancy- there was a 2-3cm defect of the left proximal fallopian tube abutting the uterus.  Remainder of left fallopian tube appeared normal.  Normal right fallopian tube.  And normal ovaries bilaterally. Small area of omental adhesions were to abdominal wall on left side.  There was some scarring of lower uterine segment preventing mobility of uterus with the manipulator.  Informed consent was obtained from the patient prior to taking her to the operating room where anesthesia was found to be adequate. She was placed in dorsal lithotomy position and examined under anesthesia. She was prepped and draped in normal sterile fashion. The bladder was catheterized with a foley under sterile technique.  A bi-valve speculum was then placed and the anterior lip of the cervix was grasped with the single tooth tenaculum. The os finder was used and the Hulka uterine manipulator was then advanced into the uterus to provide uterine mobility. The speculum and tenaculum were then removed.  Attention was then turned to the patients abdomen where a 5 mm infraumbilical skin incision was made with the scalpel. The veress needle was carefully introduced into the peritoneal cavity  with low flow attached.  Drop in pressure noted with an initial pressure of . The abdomen was then insuflated with CO2 gas. The trocar and sleeve were then advanced without difficulty into the abdomen under direct visualization. Intraabdominal placement was confirmed by the laparoscope and surveillance of the abdomen was performed with the findings as noted above.  An additional 10mm right lower quadrant port was placed as well as an additional 5mm perpendicular to camera port on patient's right.  Each area was injected with quarter percent Marcaine, incised and the trocar was placed under direct visualization.  Evacuation of hemoperitoneum was performed for better visualization.  The harmonic scalpel was then used to excise the ectopic pregnancy. Hemostasis was achieved.  The interstitial/cornual area still appeared enlarged; however, no active bleeding was noted.  Pelvis was irrigated and again the pedical was examined and appeared hemostatic. Arista was placed over the left cornua.  A Carter-Thompson was then inserted into the right  lower quadrant port for closure of the fascia. The fascia was closed using 0-vicryl without difficulty under direct visualization. The instruments were then removed from the patients abdomen with air allowed to fully escape. The port sites were then closed with monocryl. The manipulator and foley catheter was then removed from the cervix with no lacerations or bleeding identified. The patient tolerated the procedure well with all sponge, lap, and needle counts correct. The patient was taken to recovery in stable condition.  Due to the location of the ectopic pregnancy, will plan for methotrexate treatment for prophylactic measures.  Myna Hidalgo, DO Attending Obstetrician & Gynecologist, [facility] for [employer], [facility]

## 2022-12-25 NOTE — Anesthesia Postprocedure Evaluation (Signed)
Anesthesia Post Note  Patient: Denise Clark  Procedure(s) Performed: DIAGNOSTIC LAPAROSCOPY WITH REMOVAL OF ECTOPIC PRENANCY, EVACUATION OF HEMOPERITONEUM (Abdomen)     Patient location during evaluation: PACU Anesthesia Type: General Level of consciousness: awake and alert Pain management: pain level controlled Vital Signs Assessment: post-procedure vital signs reviewed and stable Respiratory status: spontaneous breathing, nonlabored ventilation, respiratory function stable and patient connected to nasal cannula oxygen Cardiovascular status: blood pressure returned to baseline and stable Postop Assessment: no apparent nausea or vomiting Anesthetic complications: no  No notable events documented.  Last Vitals:  Vitals:   12/25/22 0030 12/25/22 0045  BP: (!) 149/104 120/65  Pulse: 80 88  Resp: 20 (!) 25  Temp: 36.7 C   SpO2: 100% 100%    Last Pain:  Vitals:   12/25/22 0045  TempSrc:   PainSc: 5                  Shelton Silvas

## 2022-12-25 NOTE — Progress Notes (Signed)
Postop Note  POD #1  Subjective:  Denise Clark is a 32 y.o. G2P1011 s/p laparoscopic removal of ectopic and evacuation of hemoperitoneum. Today she notes that she is doing ok.  Still having pain, but much better than yesterday.  Medication working to improve her discomfort.. She denies any problems with ambulating, voiding or po intake. Denies nausea or vomiting. She has passed flatus, no BM.    Denies fever/chills/chest pain/SOB.    Objective: Blood pressure 127/71, pulse 87, temperature 98.1 F (36.7 C), temperature source Oral, resp. rate 19, height  (1.626 m), weight 95.3 kg, last menstrual period 11/20/2022, SpO2 100 %.  Physical Exam:  General: alert, cooperative and no distress Chest: CTAB Heart: regular rate and rhythm Abdomen: soft, mild distension noted, appropriately tender, +BS Incision: clean/dry intact with dermabond DVT Evaluation: No calf swelling or tenderness Extremities: no edema Skin: warm, dry  Results for orders placed or performed during the hospital encounter of 12/24/22 (from the past 24 hour(s))  Lipase, blood     Status: Abnormal   Collection Time: 12/24/22  4:29 PM  Result Value Ref Range   Lipase <10 (L) 11 - 51 U/L  Comprehensive metabolic panel     Status: Abnormal   Collection Time: 12/24/22  4:29 PM  Result Value Ref Range   Sodium 133 (L) 135 - 145 mmol/L   Potassium 3.6 3.5 - 5.1 mmol/L   Chloride 103 98 - 111 mmol/L   CO2 22 22 - 32 mmol/L   Glucose, Bld 117 (H) 70 - 99 mg/dL   BUN 6 6 - 20 mg/dL   Creatinine, Ser 4.40 0.44 - 1.00 mg/dL   Calcium 9.4 8.9 - 10.2 mg/dL   Total Protein 7.5 6.5 - 8.1 g/dL   Albumin 4.4 3.5 - 5.0 g/dL   AST 11 (L) 15 - 41 U/L   ALT 10 0 - 44 U/L   Alkaline Phosphatase 52 38 - 126 U/L   Total Bilirubin 0.4 0.3 - 1.2 mg/dL   GFR, Estimated >72 >53 mL/min   Anion gap 8 5 - 15  CBC     Status: Abnormal   Collection Time: 12/24/22  4:29 PM  Result Value Ref Range   WBC 12.9 (H) 4.0 - 10.5 K/uL    RBC 3.67 (L) 3.87 - 5.11 MIL/uL   Hemoglobin 11.3 (L) 12.0 - 15.0 g/dL   HCT 66.4 (L) 40.3 - 47.4 %   MCV 91.8 80.0 - 100.0 fL   MCH 30.8 26.0 - 34.0 pg   MCHC 33.5 30.0 - 36.0 g/dL   RDW 25.9 56.3 - 87.5 %   Platelets 375 150 - 400 K/uL   nRBC 0.0 0.0 - 0.2 %  Urinalysis, Routine w reflex microscopic -Urine, Clean Catch     Status: Abnormal   Collection Time: 12/24/22  4:29 PM  Result Value Ref Range   Color, Urine YELLOW YELLOW   APPearance CLEAR CLEAR   Specific Gravity, Urine 1.024 1.005 - 1.030   pH 6.0 5.0 - 8.0   Glucose, UA NEGATIVE NEGATIVE mg/dL   Hgb urine dipstick NEGATIVE NEGATIVE   Bilirubin Urine NEGATIVE NEGATIVE   Ketones, ur >80 (A) NEGATIVE mg/dL   Protein, ur TRACE (A) NEGATIVE mg/dL   Nitrite POSITIVE (A) NEGATIVE   Leukocytes,Ua NEGATIVE NEGATIVE   RBC / HPF 0-5 0 - 5 RBC/hpf   WBC, UA 0-5 0 - 5 WBC/hpf   Bacteria, UA RARE (A) NONE SEEN   Squamous Epithelial /  HPF 0-5 0 - 5 /HPF   Mucus PRESENT   Pregnancy, urine     Status: Abnormal   Collection Time: 12/24/22  4:29 PM  Result Value Ref Range   Preg Test, Ur POSITIVE (A) NEGATIVE  hCG, quantitative, pregnancy     Status: None   Collection Time: 12/24/22  4:29 PM  Result Value Ref Range   hCG, Beta Chain, Quant, S <1 <5 mIU/mL  hCG, quantitative, pregnancy     Status: Abnormal   Collection Time: 12/24/22  6:43 PM  Result Value Ref Range   hCG, Beta Chain, Quant, S 9,204 (H) <5 mIU/mL  Type and screen [facility]     Status: None   Collection Time: 12/24/22  7:30 PM  Result Value Ref Range   ABO/RH(D) O POS    Antibody Screen NEG    Sample Expiration      12/27/2022,2359 Performed at [facility], [address] [place], [place] [address]   CBC     Status: Abnormal   Collection Time: 12/24/22  7:33 PM  Result Value Ref Range   WBC 13.8 (H) 4.0 - 10.5 K/uL   RBC 3.21 (L) 3.87 - 5.11 MIL/uL   Hemoglobin 9.8 (L) 12.0 - 15.0 g/dL   HCT 60.4 (L) 54.0 - 98.1 %   MCV 94.1  80.0 - 100.0 fL   MCH 30.5 26.0 - 34.0 pg   MCHC 32.5 30.0 - 36.0 g/dL   RDW 19.1 47.8 - 29.5 %   Platelets 340 150 - 400 K/uL   nRBC 0.0 0.0 - 0.2 %  Comprehensive metabolic panel     Status: Abnormal   Collection Time: 12/25/22  4:04 AM  Result Value Ref Range   Sodium 132 (L) 135 - 145 mmol/L   Potassium 3.4 (L) 3.5 - 5.1 mmol/L   Chloride 104 98 - 111 mmol/L   CO2 22 22 - 32 mmol/L   Glucose, Bld 151 (H) 70 - 99 mg/dL   BUN 6 6 - 20 mg/dL   Creatinine, Ser 6.21 0.44 - 1.00 mg/dL   Calcium 8.1 (L) 8.9 - 10.3 mg/dL   Total Protein 6.0 (L) 6.5 - 8.1 g/dL   Albumin 3.4 (L) 3.5 - 5.0 g/dL   AST 13 (L) 15 - 41 U/L   ALT 11 0 - 44 U/L   Alkaline Phosphatase 42 38 - 126 U/L   Total Bilirubin 0.6 0.3 - 1.2 mg/dL   GFR, Estimated >30 >86 mL/min   Anion gap 6 5 - 15  CBC     Status: Abnormal   Collection Time: 12/25/22  4:04 AM  Result Value Ref Range   WBC 9.6 4.0 - 10.5 K/uL   RBC 2.53 (L) 3.87 - 5.11 MIL/uL   Hemoglobin 7.6 (L) 12.0 - 15.0 g/dL   HCT 57.8 (L) 46.9 - 62.9 %   MCV 93.7 80.0 - 100.0 fL   MCH 30.0 26.0 - 34.0 pg   MCHC 32.1 30.0 - 36.0 g/dL   RDW 52.8 41.3 - 24.4 %   Platelets 256 150 - 400 K/uL   nRBC 0.0 0.0 - 0.2 %    Assessment/Plan: Denise Clark is a 32 y.o. G2P1011 s/p aparoscopic removal of ectopic and evacuation of hemoperitoneum. POD#1  -due to location of ectopic, additional treatment of Methotrexate given -pain appropriate -encourage ambulation as tolerated -doing well with general diet -Hgb as above, suspected declined due to large hemoperitoneum.  Pt is currently  asymptomatic.  Plan to start iron every other day and will closely follow.  Meeting postop milestones appropriately, will likely plan for discharge home later today with close outpatient follow up for repeat HCG and Hgb.   LOS: 0 days   Myna Hidalgo, DO Faculty Attending, Center for [facility] 12/25/2022, 7:08 AM

## 2022-12-25 NOTE — Transfer of Care (Signed)
Immediate Anesthesia Transfer of Care Note  Patient: Denise Clark  Procedure(s) Performed: DIAGNOSTIC LAPAROSCOPY WITH REMOVAL OF ECTOPIC PRENANCY, EVACUATION OF HEMOPERITONEUM (Abdomen)  Patient Location: PACU  Anesthesia Type:General  Level of Consciousness: awake, alert , and oriented  Airway & Oxygen Therapy: Patient Spontanous Breathing and Patient connected to nasal cannula oxygen  Post-op Assessment: Report given to RN, Post -op Vital signs reviewed and stable, and Patient moving all extremities  Post vital signs: Reviewed and stable  Last Vitals:  Vitals Value Taken Time  BP    Temp    Pulse 96 12/25/22 0032  Resp 23 12/25/22 0032  SpO2 100 % 12/25/22 0032  Vitals shown include unvalidated device data.  Last Pain:  Vitals:   12/24/22 2128  TempSrc:   PainSc: 0-No pain         Complications: No notable events documented.

## 2022-12-26 ENCOUNTER — Telehealth: Payer: Self-pay | Admitting: Obstetrics & Gynecology

## 2022-12-26 LAB — URINE CULTURE: Culture: >=100000 — AB

## 2022-12-26 LAB — SURGICAL PATHOLOGY

## 2022-12-26 NOTE — Telephone Encounter (Signed)
Called pt to check in after surgery. Feeling better today now that she has had a BM.  Taking pain medication as needed.  Tolerating gen diet.  Denies headache or dizziness.  Overall doing ok.  Encouraged pt to call office to schedule follow up  Myna Hidalgo, DO Attending Obstetrician & Gynecologist, [facility] for [facility], [facility]

## 2022-12-26 NOTE — Discharge Summary (Signed)
Physician Discharge Summary  Patient ID: Denise Clark MRN: [id number] DOB/AGE: 04-01-1991 32 y.o.  Admit date: 12/24/2022 Discharge date: 12/26/2022  Admission Diagnoses:  Discharge Diagnoses:  Principal Problem:   Ruptured ectopic pregnancy Active Problems:   UTI (urinary tract infection)   Discharged Condition: stable  Hospital Course: 31yo G3P1011 who was admitted due to ruptured ectopic.  She underwent a diagnostic laparoscopy- removal of ectopic pregnancy and evacuation of hemoperitoneum.  See operative note regarding procedure.  Due to interstitial ectopic pregnancy, pt given dose of Methotrexate.  Anemia noted due to acute blood loss- pt asymptomatic and vitals stable.  Pt also noted to have UTI- treated with IV Rocephin and outpatient oral antibiotics. She was discharged home with plans for close outpatient management for Hgb and hCG recheck. None Consults: None  Significant Diagnostic Studies: labs:  Results for orders placed or performed during the hospital encounter of 12/24/22 (from the past 48 hour(s))  Lipase, blood     Status: Abnormal   Collection Time: 12/24/22  4:29 PM  Result Value Ref Range   Lipase <10 (L) 11 - 51 U/L    Comment: Performed at [facility], [address] [place], [place] [address]  Comprehensive metabolic panel     Status: Abnormal   Collection Time: 12/24/22  4:29 PM  Result Value Ref Range   Sodium 133 (L) 135 - 145 mmol/L   Potassium 3.6 3.5 - 5.1 mmol/L   Chloride 103 98 - 111 mmol/L   CO2 22 22 - 32 mmol/L   Glucose, Bld 117 (H) 70 - 99 mg/dL    Comment: Glucose reference range applies only to samples taken after fasting for at least 8 hours.   BUN 6 6 - 20 mg/dL   Creatinine, Ser 1.91 0.44 - 1.00 mg/dL   Calcium 9.4 8.9 - 47.8 mg/dL   Total Protein 7.5 6.5 - 8.1 g/dL   Albumin 4.4 3.5 - 5.0 g/dL   AST 11 (L) 15 - 41 U/L   ALT 10 0 - 44 U/L   Alkaline Phosphatase 52 38 - 126 U/L   Total Bilirubin 0.4 0.3  - 1.2 mg/dL   GFR, Estimated >29 >56 mL/min    Comment: (NOTE) Calculated using the CKD-EPI Creatinine Equation (2021)    Anion gap 8 5 - 15    Comment: Performed at [facility], [address] [place], [place] [address]  CBC     Status: Abnormal   Collection Time: 12/24/22  4:29 PM  Result Value Ref Range   WBC 12.9 (H) 4.0 - 10.5 K/uL   RBC 3.67 (L) 3.87 - 5.11 MIL/uL   Hemoglobin 11.3 (L) 12.0 - 15.0 g/dL   HCT 65.7 (L) 84.6 - 96.2 %   MCV 91.8 80.0 - 100.0 fL   MCH 30.8 26.0 - 34.0 pg   MCHC 33.5 30.0 - 36.0 g/dL   RDW 95.2 84.1 - 32.4 %   Platelets 375 150 - 400 K/uL   nRBC 0.0 0.0 - 0.2 %    Comment: Performed at [facility], [address] [place], [place] [address]  Urinalysis, Routine w reflex microscopic -Urine, Clean Catch     Status: Abnormal   Collection Time: 12/24/22  4:29 PM  Result Value Ref Range   Color, Urine YELLOW YELLOW   APPearance CLEAR CLEAR   Specific Gravity, Urine 1.024 1.005 - 1.030   pH 6.0 5.0 - 8.0   Glucose, UA NEGATIVE NEGATIVE mg/dL  Hgb urine dipstick NEGATIVE NEGATIVE   Bilirubin Urine NEGATIVE NEGATIVE   Ketones, ur >80 (A) NEGATIVE mg/dL   Protein, ur TRACE (A) NEGATIVE mg/dL   Nitrite POSITIVE (A) NEGATIVE   Leukocytes,Ua NEGATIVE NEGATIVE   RBC / HPF 0-5 0 - 5 RBC/hpf   WBC, UA 0-5 0 - 5 WBC/hpf   Bacteria, UA RARE (A) NONE SEEN   Squamous Epithelial / HPF 0-5 0 - 5 /HPF   Mucus PRESENT     Comment: Performed at [facility], [address], [place], [place] [address]  Pregnancy, urine     Status: Abnormal   Collection Time: 12/24/22  4:29 PM  Result Value Ref Range   Preg Test, Ur POSITIVE (A) NEGATIVE    Comment: Performed at [facility], [address], [place], [place] [address]  hCG, quantitative, pregnancy     Status: None   Collection Time: 12/24/22  4:29 PM  Result Value Ref Range   hCG, Beta Chain, Quant, S PLEASE DISREGARD RESULT <5  mIU/mL    Comment: CORRECTED RESULTS CALLED TO: FOUNTAIN,M,RN @ 1448 12/25/22 BY GWYN,P          GEST. AGE      CONC.  (mIU/mL)   <=1 WEEK        5 - 50     2 WEEKS       50 - 500     3 WEEKS       100 - 10,000     4 WEEKS     1,000 - 30,000     5 WEEKS     3,500 - 115,000   6-8 WEEKS     12,000 - 270,000    12 WEEKS     15,000 - 220,000        FEMALE AND NON-PREGNANT FEMALE:     LESS THAN 5 mIU/mL Performed at [facility], [address] [place], [place] [address] CORRECTED ON 04/24 AT 1459: PREVIOUSLY REPORTED AS <1          GEST. AGE      CONC.  (mIU/mL)   <=1 WEEK        5   50     2 WEEKS       50   500     3 WEEKS       100   10,000     4 WEEKS     1,000   30,000     5 WEEKS     3,500   115,000   6 8 WEEKS      12,000   270,000    12 WEEKS     15,000   220,000        FEMALE AND NON PREGNANT FEMALE:     LESS THAN 5 mIU/mL REPEATED TO VERIFY ACCURACY OF RESULTS QUESTIONABLE. RECOMMEND RECOLLECT TO VERIFY.   Urine Culture (for pregnant, neutropenic or urologic patients or patients with an indwelling urinary catheter)     Status: Abnormal   Collection Time: 12/24/22  6:17 PM   Specimen: Urine, Clean Catch  Result Value Ref Range   Specimen Description      URINE, CLEAN CATCH Performed at [facility], [address], [place], [place] [address]    Special Requests      NONE Performed at [facility], [address] [place], [place] [address]    Culture >=100,000 COLONIES/mL ESCHERICHIA COLI (A)    Report Status  12/26/2022 FINAL    Organism ID, Bacteria ESCHERICHIA COLI (A)       Susceptibility   Escherichia coli - MIC*    AMPICILLIN 8 SENSITIVE Sensitive     CEFAZOLIN <=4 SENSITIVE Sensitive     CEFEPIME <=0.12 SENSITIVE Sensitive     CEFTRIAXONE <=0.25 SENSITIVE Sensitive     CIPROFLOXACIN <=0.25 SENSITIVE Sensitive     GENTAMICIN <=1 SENSITIVE Sensitive     IMIPENEM <=0.25 SENSITIVE Sensitive     NITROFURANTOIN  <=16 SENSITIVE Sensitive     TRIMETH/SULFA <=20 SENSITIVE Sensitive     AMPICILLIN/SULBACTAM <=2 SENSITIVE Sensitive     PIP/TAZO <=4 SENSITIVE Sensitive     * >=100,000 COLONIES/mL ESCHERICHIA COLI  hCG, quantitative, pregnancy     Status: Abnormal   Collection Time: 12/24/22  6:43 PM  Result Value Ref Range   hCG, Beta Chain, Quant, S 9,204 (H) <5 mIU/mL    Comment:          GEST. AGE      CONC.  (mIU/mL)   <=1 WEEK        5 - 50     2 WEEKS       50 - 500     3 WEEKS       100 - 10,000     4 WEEKS     1,000 - 30,000     5 WEEKS     3,500 - 115,000   6-8 WEEKS     12,000 - 270,000    12 WEEKS     15,000 - 220,000        FEMALE AND NON-PREGNANT FEMALE:     LESS THAN 5 mIU/mL RESULT CONFIRMED BY AUTOMATED DILUTION Performed at [facility], [address] [place], [place] [address]   Type and screen [facility]     Status: None   Collection Time: 12/24/22  7:30 PM  Result Value Ref Range   ABO/RH(D) O POS    Antibody Screen NEG    Sample Expiration      12/27/2022,2359 Performed at [facility], [address] [place], [place] [address]   CBC     Status: Abnormal   Collection Time: 12/24/22  7:33 PM  Result Value Ref Range   WBC 13.8 (H) 4.0 - 10.5 K/uL   RBC 3.21 (L) 3.87 - 5.11 MIL/uL   Hemoglobin 9.8 (L) 12.0 - 15.0 g/dL   HCT 09.8 (L) 11.9 - 14.7 %   MCV 94.1 80.0 - 100.0 fL   MCH 30.5 26.0 - 34.0 pg   MCHC 32.5 30.0 - 36.0 g/dL   RDW 82.9 56.2 - 13.0 %   Platelets 340 150 - 400 K/uL   nRBC 0.0 0.0 - 0.2 %    Comment: Performed at [facility], [address] [place], [place] [address]  Surgical pathology     Status: None   Collection Time: 12/24/22 11:40 PM  Result Value Ref Range   SURGICAL PATHOLOGY      SURGICAL PATHOLOGY CASE: [id number] PATIENT: Denise Clark Surgical Pathology Report     Clinical History: ectopic pregnancy (cm)     FINAL MICROSCOPIC DIAGNOSIS:  A. ECTOPIC PREGNANCY: -   Blood and immature chorionic villi consistent with products of conception and the clinical history of ectopic pregnancy.    GROSS DESCRIPTION:  Received in formalin are 3 fragments of pink-tan to hemorrhagic soft tissue ranging from 0.8 to 1.8 cm.  Distinct tubular  tissue/fetal structures/villous tissue are not grossly identified.  The specimen is entirely submitted in 2 blocks. (KW, 12/25/2022)    Final Diagnosis performed by Orene Desanctis DO.   Electronically signed 12/26/2022 Technical component performed at [employer], [address] [place], [place] [address].  Professional component performed at [facility], [address], [place], [place] [address].  Immunohistochemistry Technical component (if applicable) was perf ormed at [facility]. [address], [place], [place] [address].   IMMUNOHISTOCHEMISTRY DISCLAIMER (if applicable): Some of these immunohistochemical stains may have been developed and the performance characteristics determine by [facility]. Some may not have been cleared or approved by the U.S. Food and Drug Administration. The FDA has determined that such clearance or approval is not necessary. This test is used for clinical purposes. It should not be regarded as investigational or for research. This laboratory is certified under the Clinical Laboratory Improvement Amendments of 1988 (CLIA-88) as qualified to perform high complexity clinical laboratory testing.  The controls stained appropriately.   Comprehensive metabolic panel     Status: Abnormal   Collection Time: 12/25/22  4:04 AM  Result Value Ref Range   Sodium 132 (L) 135 - 145 mmol/L   Potassium 3.4 (L) 3.5 - 5.1 mmol/L   Chloride 104 98 - 111 mmol/L   CO2 22 22 - 32 mmol/L   Glucose, Bld 151 (H) 70 - 99 mg/dL    Comment: Glucose reference range applies only to samples taken after fasting for at least 8 hours.    BUN 6 6 - 20 mg/dL   Creatinine, Ser 6.29 0.44 - 1.00 mg/dL   Calcium 8.1 (L) 8.9 - 10.3 mg/dL   Total Protein 6.0 (L) 6.5 - 8.1 g/dL   Albumin 3.4 (L) 3.5 - 5.0 g/dL   AST 13 (L) 15 - 41 U/L   ALT 11 0 - 44 U/L   Alkaline Phosphatase 42 38 - 126 U/L   Total Bilirubin 0.6 0.3 - 1.2 mg/dL   GFR, Estimated >52 >84 mL/min    Comment: (NOTE) Calculated using the CKD-EPI Creatinine Equation (2021)    Anion gap 6 5 - 15    Comment: Performed at [facility], [address], [place], [place] [address]  CBC     Status: Abnormal   Collection Time: 12/25/22  4:04 AM  Result Value Ref Range   WBC 9.6 4.0 - 10.5 K/uL   RBC 2.53 (L) 3.87 - 5.11 MIL/uL   Hemoglobin 7.6 (L) 12.0 - 15.0 g/dL   HCT 01.0 (L) 27.2 - 53.6 %   MCV 93.7 80.0 - 100.0 fL   MCH 30.0 26.0 - 34.0 pg   MCHC 32.1 30.0 - 36.0 g/dL   RDW 64.4 03.4 - 74.2 %   Platelets 256 150 - 400 K/uL   nRBC 0.0 0.0 - 0.2 %    Comment: Performed at [facility], [address] [place], [place] [address]     Treatments: IV hydration, antibiotics: Rocephin, and surgery: Laparoscopic removal of ectopic, evacuation of hemoperitoneum  Discharge Exam: Blood pressure 129/60, pulse 75, temperature 98.2 F (36.8 C), temperature source Oral, resp. rate (!) 25, height  (1.626 m), weight 95.3 kg, last menstrual period 11/20/2022, SpO2 100 %.    Physical Exam:  General: alert, cooperative and no distress Chest: CTAB Heart: regular rate and rhythm Abdomen: soft, mild distension noted, appropriately tender, +BS Incision: clean/dry intact with  dermabond DVT Evaluation: No calf swelling or tenderness Extremities: no edema Skin: warm, dry  Disposition: Discharge disposition: 01-Home or Self Care        Allergies as of 12/25/2022   No Known Allergies      Medication List     STOP taking these medications    ARIPiprazole ER 400 MG Srer injection Commonly known as: ABILIFY MAINTENA   Implanon 68 MG Impl  implant Generic drug: etonogestrel   loperamide 2 MG capsule Commonly known as: IMODIUM   nitrofurantoin (macrocrystal-monohydrate) 100 MG capsule Commonly known as: MACROBID   OLANZapine 15 MG tablet Commonly known as: ZYPREXA   promethazine 25 MG tablet Commonly known as: PHENERGAN       TAKE these medications    acetaminophen 325 MG tablet Commonly known as: TYLENOL Take 2 tablets (650 mg total) by mouth every 6 (six) hours as needed.   cefadroxil 500 MG capsule Commonly known as: DURICEF Take 1 capsule (500 mg total) by mouth 2 (two) times daily.   docusate sodium 100 MG capsule Commonly known as: COLACE Take 1 capsule (100 mg total) by mouth 2 (two) times daily for 20 days.   ferrous gluconate 324 MG tablet Commonly known as: FERGON Take 1 tablet (324 mg total) by mouth every other day.   HYDROcodone-acetaminophen 5-325 MG tablet Commonly known as: NORCO/VICODIN Take 1-2 tablets by mouth every 6 (six) hours as needed for up to 7 days for moderate pain or severe pain.   ibuprofen 600 MG tablet Commonly known as: ADVIL Take 1 tablet (600 mg total) by mouth every 6 (six) hours.   ondansetron 4 MG tablet Commonly known as: ZOFRAN Take 1 tablet (4 mg total) by mouth every 6 (six) hours as needed for nausea.        Follow-up Information     [facility] Follow up.   Specialty: Obstetrics and Gynecology Why: Please follow up in one week- an appointment will be scheduled for you.  Any questions before then, please call the office. Contact information: [address] [place] [address] [phone]                Signed: Myna Hidalgo, DO Attending Obstetrician & Gynecologist, [facility] for [facility], [facility]

## 2022-12-30 ENCOUNTER — Telehealth: Payer: Self-pay | Admitting: Obstetrics & Gynecology

## 2022-12-30 ENCOUNTER — Encounter (HOSPITAL_COMMUNITY): Payer: Self-pay | Admitting: Family Medicine

## 2022-12-30 ENCOUNTER — Inpatient Hospital Stay (HOSPITAL_COMMUNITY)
Admission: AD | Admit: 2022-12-30 | Discharge: 2022-12-30 | Disposition: A | Discharge status: Home / Self Care | Payer: Medicaid Other | Attending: Family Medicine | Admitting: Family Medicine

## 2022-12-30 DIAGNOSIS — M549 Dorsalgia, unspecified: Secondary | ICD-10-CM | POA: Insufficient documentation

## 2022-12-30 DIAGNOSIS — G8918 Other acute postprocedural pain: Secondary | ICD-10-CM | POA: Diagnosis not present

## 2022-12-30 DIAGNOSIS — Z8759 Personal history of other complications of pregnancy, childbirth and the puerperium: Secondary | ICD-10-CM | POA: Insufficient documentation

## 2022-12-30 LAB — COMPREHENSIVE METABOLIC PANEL
ALT: 22 U/L (ref 0–44)
AST: 13 U/L — ABNORMAL LOW (ref 15–41)
Albumin: 3.6 g/dL (ref 3.5–5.0)
Alkaline Phosphatase: 47 U/L (ref 38–126)
Anion gap: 8 (ref 5–15)
BUN: <5 mg/dL — ABNORMAL LOW (ref 6–20)
CO2: 24 mmol/L (ref 22–32)
Calcium: 8.8 mg/dL — ABNORMAL LOW (ref 8.9–10.3)
Chloride: 105 mmol/L (ref 98–111)
Creatinine, Ser: 0.65 mg/dL (ref 0.44–1.00)
GFR, Estimated: >60 mL/min (ref >60)
Glucose, Bld: 98 mg/dL (ref 70–99)
Potassium: 3.7 mmol/L (ref 3.5–5.1)
Sodium: 137 mmol/L (ref 135–145)
Total Bilirubin: 0.3 mg/dL (ref 0.3–1.2)
Total Protein: 6.5 g/dL (ref 6.5–8.1)

## 2022-12-30 LAB — CBC
HCT: 22.5 % — ABNORMAL LOW (ref 36.0–46.0)
Hemoglobin: 7.4 g/dL — ABNORMAL LOW (ref 12.0–15.0)
MCH: 31.1 pg (ref 26.0–34.0)
MCHC: 32.9 g/dL (ref 30.0–36.0)
MCV: 94.5 fL (ref 80.0–100.0)
Platelets: 332 10*3/uL (ref 150–400)
RBC: 2.38 MIL/uL — ABNORMAL LOW (ref 3.87–5.11)
RDW: 15 % (ref 11.5–15.5)
WBC: 8.6 10*3/uL (ref 4.0–10.5)
nRBC: 0 % (ref 0.0–0.2)

## 2022-12-30 LAB — HCG, QUANTITATIVE, PREGNANCY: hCG, Beta Chain, Quant, S: 407 m[IU]/mL — ABNORMAL HIGH (ref <5)

## 2022-12-30 MED ORDER — TRAMADOL HCL 50 MG PO TABS: 50.0000 mg | ORAL_TABLET | Freq: Four times a day (QID) | ORAL | 0 refills | Status: DC

## 2022-12-30 MED ORDER — TRAMADOL HCL 50 MG PO TABS
50.0000 mg | ORAL_TABLET | Freq: Once | ORAL | Status: AC
  Administered 2022-12-30: 50 mg via ORAL
  Filled 2022-12-30: qty 1

## 2022-12-30 NOTE — MAU Note (Addendum)
.  Denise Clark is a 32 y.o. at Unknown here in MAU reporting:  On the 23rd of April Pt had an emergency surgery due to having a tubal pregnancy and a cyst in her ovary. Pt reports that every since the surgery she has had some severe  pain and back spasms that wake her from her sleep and takes her breath away.  Pt states she was prescribed hydrocodone and it made her itchy so she took some benadryl.  Pt reports the pain being a 10/10 when it does come Pain location- Left mid lower.   Pt took ibuprofen for pain at  6pm today.  Pt is currently being treated for a UTI, No bleeding or clots or discharge.   Currently in no pain.     Vitals:   12/30/22 2113  BP: 129/78  Pulse: 74  Resp: 18  Temp: 97.8 F (36.6 C)  SpO2: 100%

## 2022-12-30 NOTE — Telephone Encounter (Signed)
Pt is requesting a call about her pain medication. [phone] is the best number to reach her today. Please advise

## 2022-12-30 NOTE — Telephone Encounter (Signed)
Spoke with patient. She reports severe pain in her back and left side. She also has sob. Pt taking all pain medications, with no relief. I discussed pt with Dr. Charlotta Newton she recommends that patient go to MAU for evaluation. Pt is agreeable.

## 2022-12-30 NOTE — MAU Provider Note (Signed)
History     CSN: [id number]  Arrival date and time: 12/30/22 2012   Event Date/Time   First Provider Initiated Contact with Patient 12/30/22 2129      Chief Complaint  Patient presents with   Back Pain   HPI  Denise Clark is a 32 y.o. G2P1011 postop from a ruptured ectopic on 4/24 who presents for evaluation of back pain. Patient reports she has back pain at night that wakes her up out of her sleep. She reports she took the hydrocodone which made her itch so she is worried she is allergic to it and has not taken anymore. She reports she is not currently having pain but is worried about what will happen at night with no pain relief.  She denies any vaginal bleeding. Denies any constipation, diarrhea or any urinary complaints.   OB History     Gravida  2   Para  1   Term  1   Preterm      AB  1   Living  1      SAB      IAB      Ectopic  1   Multiple      Live Births  1           Past Medical History:  Diagnosis Date   Bipolar disorder (HCC)    Ectopic pregnancy    Schizophrenia (HCC)     Past Surgical History:  Procedure Laterality Date   CESAREAN SECTION     DIAGNOSTIC LAPAROSCOPY WITH REMOVAL OF ECTOPIC PREGNANCY N/A 12/24/2022   Procedure: DIAGNOSTIC LAPAROSCOPY WITH REMOVAL OF ECTOPIC PRENANCY, EVACUATION OF HEMOPERITONEUM;  Surgeon: Myna Hidalgo, DO;  Location: [facility] OR;  Service: Gynecology;  Laterality: N/A;   ECTOPIC PREGNANCY SURGERY      History reviewed. No pertinent family history.  Social History   Tobacco Use   Smoking status: Some Days    Packs/day: .05    Types: Cigars, Cigarettes   Smokeless tobacco: Never  Vaping Use   Vaping Use: Never used  Substance Use Topics   Alcohol use: No   Drug use: No    Allergies:  Allergies  Allergen Reactions   Hydrocodone Itching    Medications Prior to Admission  Medication Sig Dispense Refill Last Dose   ibuprofen (ADVIL) 600 MG tablet Take 1 tablet (600 mg total) by mouth  every 6 (six) hours. 30 tablet 0 12/30/2022 at 1800   acetaminophen (TYLENOL) 325 MG tablet Take 2 tablets (650 mg total) by mouth every 6 (six) hours as needed.      cefadroxil (DURICEF) 500 MG capsule Take 1 capsule (500 mg total) by mouth 2 (two) times daily. 14 capsule 0    docusate sodium (COLACE) 100 MG capsule Take 1 capsule (100 mg total) by mouth 2 (two) times daily for 20 days. 40 capsule 0    ferrous gluconate (FERGON) 324 MG tablet Take 1 tablet (324 mg total) by mouth every other day. 30 tablet 0    HYDROcodone-acetaminophen (NORCO/VICODIN) 5-325 MG tablet Take 1-2 tablets by mouth every 6 (six) hours as needed for up to 7 days for moderate pain or severe pain. 24 tablet 0    ondansetron (ZOFRAN) 4 MG tablet Take 1 tablet (4 mg total) by mouth every 6 (six) hours as needed for nausea. 20 tablet 0     Review of Systems  Constitutional: Negative.  Negative for fatigue and fever.  HENT: Negative.    Respiratory:  Negative.  Negative for shortness of breath.   Cardiovascular: Negative.  Negative for chest pain.  Gastrointestinal: Negative.  Negative for abdominal pain, constipation, diarrhea, nausea and vomiting.  Genitourinary: Negative.  Negative for dysuria, vaginal bleeding and vaginal discharge.  Musculoskeletal:  Positive for back pain.  Neurological: Negative.  Negative for dizziness and headaches.   Physical Exam   Blood pressure 129/78, pulse 74, temperature 97.8 F (36.6 C), temperature source Oral, resp. rate 18, height 5\' 4"  (1.626 m), weight 81.6 kg, last menstrual period 11/20/2022, SpO2 100 %.  Patient Vitals for the past 24 hrs:  BP Temp Temp src Pulse Resp SpO2 Height Weight  12/30/22 2113 129/78 97.8 F (36.6 C) Oral 74 18 100 % -- --  12/30/22 2101 -- -- -- -- -- -- 5\' 4"  (1.626 m) 81.6 kg    Physical Exam Vitals and nursing note reviewed.  Constitutional:      General: She is not in acute distress.    Appearance: She is well-developed.  HENT:     Head:  Normocephalic.  Eyes:     Pupils: Pupils are equal, round, and reactive to light.  Cardiovascular:     Rate and Rhythm: Normal rate and regular rhythm.     Heart sounds: Normal heart sounds.  Pulmonary:     Effort: Pulmonary effort is normal. No respiratory distress.     Breath sounds: Normal breath sounds.  Abdominal:     General: Bowel sounds are normal. There is no distension.     Palpations: Abdomen is soft.     Tenderness: There is no abdominal tenderness.  Skin:    General: Skin is warm and dry.  Neurological:     Mental Status: She is alert and oriented to person, place, and time.  Psychiatric:        Mood and Affect: Mood normal.        Behavior: Behavior normal.        Thought Content: Thought content normal.        Judgment: Judgment normal.     MAU Course  Procedures  Results for orders placed or performed during the hospital encounter of 12/30/22 (from the past 24 hour(s))  CBC     Status: Abnormal   Collection Time: 12/30/22  9:07 PM  Result Value Ref Range   WBC 8.6 4.0 - 10.5 K/uL   RBC 2.38 (L) 3.87 - 5.11 MIL/uL   Hemoglobin 7.4 (L) 12.0 - 15.0 g/dL   HCT 16.1 (L) 09.6 - 04.5 %   MCV 94.5 80.0 - 100.0 fL   MCH 31.1 26.0 - 34.0 pg   MCHC 32.9 30.0 - 36.0 g/dL   RDW 40.9 81.1 - 91.4 %   Platelets 332 150 - 400 K/uL   nRBC 0.0 0.0 - 0.2 %  hCG, quantitative, pregnancy     Status: Abnormal   Collection Time: 12/30/22  9:07 PM  Result Value Ref Range   hCG, Beta Chain, Quant, S 407 (H) <5 mIU/mL  Comprehensive metabolic panel     Status: Abnormal   Collection Time: 12/30/22  9:07 PM  Result Value Ref Range   Sodium 137 135 - 145 mmol/L   Potassium 3.7 3.5 - 5.1 mmol/L   Chloride 105 98 - 111 mmol/L   CO2 24 22 - 32 mmol/L   Glucose, Bld 98 70 - 99 mg/dL   BUN <5 (L) 6 - 20 mg/dL   Creatinine, Ser 7.82 0.44 - 1.00 mg/dL   Calcium  8.8 (L) 8.9 - 10.3 mg/dL   Total Protein 6.5 6.5 - 8.1 g/dL   Albumin 3.6 3.5 - 5.0 g/dL   AST 13 (L) 15 - 41 U/L    ALT 22 0 - 44 U/L   Alkaline Phosphatase 47 38 - 126 U/L   Total Bilirubin 0.3 0.3 - 1.2 mg/dL   GFR, Estimated >16 >10 mL/min   Anion gap 8 5 - 15   MDM Labs ordered and reviewed.   CBC, CMP, HCG Tramadol PO- patient reports feeling much better.   Hgb stable from previous lab draw. No sign of infection  Reviewed that pain is likely from gas during surgery. Discussed expected healing and warning signs of when to return to hospital  Assessment and Plan   1. Post-op pain   2. Hx of ectopic pregnancy     -Discharge home in stable condition -Rx for tramadol sent to pharmacy -Post-op precautions discussed -Patient advised to follow-up with OB as scheduled for post op care -Patient may return to MAU as needed or if her condition were to change or worsen  Rolm Bookbinder, CNM 12/30/2022, 9:30 PM

## 2023-01-01 ENCOUNTER — Ambulatory Visit (INDEPENDENT_AMBULATORY_CARE_PROVIDER_SITE_OTHER): Payer: Medicaid Other | Admitting: Obstetrics & Gynecology

## 2023-01-01 ENCOUNTER — Encounter: Payer: Self-pay | Admitting: Obstetrics & Gynecology

## 2023-01-01 ENCOUNTER — Other Ambulatory Visit: Payer: Self-pay

## 2023-01-01 VITALS — BP 123/72 | HR 111 | Wt 183.1 lb

## 2023-01-01 DIAGNOSIS — O009 Unspecified ectopic pregnancy without intrauterine pregnancy: Secondary | ICD-10-CM

## 2023-01-01 DIAGNOSIS — Z3A00 Weeks of gestation of pregnancy not specified: Secondary | ICD-10-CM

## 2023-01-01 MED ORDER — TRAMADOL HCL 50 MG PO TABS
50.0000 mg | ORAL_TABLET | Freq: Four times a day (QID) | ORAL | 0 refills | Status: AC | PRN
Start: 2023-01-01 — End: ?

## 2023-01-01 MED ORDER — FERROUS SULFATE 325 (65 FE) MG PO TABS
325.0000 mg | ORAL_TABLET | ORAL | 3 refills | Status: AC
Start: 2023-01-01 — End: ?

## 2023-01-01 NOTE — Progress Notes (Signed)
Subjective:1 week postop     Denise Clark is a 32 y.o. female who presents to the clinic 1 weeks status post laparoscopy for  ectopic pregnancy . Eating a regular diet without difficulty. Bowel movements are normal. Pain is not well controlled.  Medications being used: ibuprofen (OTC).  The following portions of the patient's history were reviewed and updated as appropriate: allergies, current medications, past family history, past medical history, past social history, past surgical history, and problem list.  Review of Systems Constitutional: positive for fatigue    Objective:    BP 123/72   Pulse (!) 111   Wt 183 lb 1.6 oz (83.1 kg)   LMP 11/20/2022 (Exact Date)   BMI 31.43 kg/m  General:  alert, cooperative, and no distress  Abdomen: soft, bowel sounds active, non-tender  Incision:   healing well, no drainage, no erythema, no hernia, no seroma, no swelling, no dehiscence, incision well approximated     Assessment:    Postoperative course complicated by need for pain medication Operative findings again reviewed. Pathology report discussed.    Plan:    1. Continue any current medications. 2. Wound care discussed. 3. Activity restrictions: none 4. Anticipated return to work: 1-2 weeks. 5. Follow up: 2 weeks for suture removal.   Orders Placed This Encounter  Procedures   Beta hCG quant (ref lab)   CBC

## 2023-01-02 LAB — CBC
Hematocrit: 24.2 % — ABNORMAL LOW (ref 34.0–46.6)
Hemoglobin: 7.6 g/dL — ABNORMAL LOW (ref 11.1–15.9)
MCH: 30.3 pg (ref 26.6–33.0)
MCHC: 31.4 g/dL — ABNORMAL LOW (ref 31.5–35.7)
MCV: 96 fL (ref 79–97)
Platelets: 356 10*3/uL (ref 150–450)
RBC: 2.51 x10E6/uL — CL (ref 3.77–5.28)
RDW: 12.6 % (ref 11.7–15.4)
WBC: 9.8 10*3/uL (ref 3.4–10.8)

## 2023-01-02 LAB — BETA HCG QUANT (REF LAB): hCG Quant: 187 m[IU]/mL

## 2023-01-09 ENCOUNTER — Telehealth: Payer: Self-pay | Admitting: Lactation Services

## 2023-01-09 NOTE — Telephone Encounter (Signed)
Called and spoke with patient. She reports she is still having pain in the abdomen of a 5/10. She reports she is taking Tylenol and Ibuprofen that will help some and then pain comes back. She reports that the Tramadol is not helping. She is having spotting still.   She is in [place] today and will return to [place] this evening, reviewed with her that she needs to return to MAU for evaluation for pain and HCG level so they can determine if any further actions are needed.

## 2023-01-09 NOTE — Telephone Encounter (Signed)
-----   Message from Adam Phenix, MD sent at 01/08/2023 10:14 AM EDT ----- I recommend that HCG be repeated as there is some possibility of persistent chorionic tissue in the tube after her ectopic surgery

## 2023-01-10 ENCOUNTER — Ambulatory Visit (INDEPENDENT_AMBULATORY_CARE_PROVIDER_SITE_OTHER): Payer: Medicaid Other

## 2023-01-10 VITALS — BP 128/67 | HR 68 | Temp 98.5°F

## 2023-01-10 DIAGNOSIS — Z3A00 Weeks of gestation of pregnancy not specified: Secondary | ICD-10-CM

## 2023-01-10 DIAGNOSIS — O009 Unspecified ectopic pregnancy without intrauterine pregnancy: Secondary | ICD-10-CM

## 2023-01-10 DIAGNOSIS — M62838 Other muscle spasm: Secondary | ICD-10-CM

## 2023-01-10 DIAGNOSIS — R112 Nausea with vomiting, unspecified: Secondary | ICD-10-CM

## 2023-01-10 LAB — BETA HCG QUANT (REF LAB): hCG Quant: 25 m[IU]/mL

## 2023-01-10 MED ORDER — ONDANSETRON HCL 4 MG PO TABS
4.0000 mg | ORAL_TABLET | Freq: Four times a day (QID) | ORAL | 0 refills | Status: AC | PRN
Start: 2023-01-10 — End: ?

## 2023-01-10 MED ORDER — CYCLOBENZAPRINE HCL 10 MG PO TABS
10.0000 mg | ORAL_TABLET | Freq: Three times a day (TID) | ORAL | 0 refills | Status: AC | PRN
Start: 2023-01-10 — End: ?

## 2023-01-10 NOTE — Progress Notes (Signed)
Beta HCG Follow-up Visit  HANORA SCRUTON presents to [facility] with complaint of back pain following ruptured ectopic and surgery on 12/24/22. Was told by a Jasmine December, RN yesterday to return to [facility] so she felt she should come to be seen. Reviewed with Crissie Reese who recommends repeat HCG today.  Pain is 5/10 mid back. took ibuprofen 600 mg this AM. Does not feel pain is well controlled. Unable to take prescribed hydrocodone due to itching. Seen at [facility] 12/30/22 and given Tramadol- reports taking 2 tablets at time and has 3 pills left. Also taking ibuprofen and Tylenol occasionally. BM every other day-- taking BID colace, recommended Miralax.  Eckstat to bedside for brief exam. Recommends Flexeril for likely muscle spasm. Also gives verbal order for refill of Zofran due to continued nausea. Results of HCG level will be follow by OBGYN attending in hospital due to office closure. Pt to follow up with Debroah Loop, MD on 01/20/23.  Marjo Bicker 01/10/2023 8:57 AM

## 2023-01-10 NOTE — Progress Notes (Signed)
Hi Ms Neylon,  Your hcg is going down which is great news. We'll plan to recheck it again next week.   Dr. Crissie Reese

## 2023-01-20 ENCOUNTER — Ambulatory Visit (INDEPENDENT_AMBULATORY_CARE_PROVIDER_SITE_OTHER): Payer: Medicaid Other | Admitting: Obstetrics & Gynecology

## 2023-01-20 ENCOUNTER — Encounter: Payer: Self-pay | Admitting: Obstetrics & Gynecology

## 2023-01-20 ENCOUNTER — Other Ambulatory Visit: Payer: Self-pay

## 2023-01-20 VITALS — BP 123/76 | HR 88 | Wt 179.7 lb

## 2023-01-20 DIAGNOSIS — Z09 Encounter for follow-up examination after completed treatment for conditions other than malignant neoplasm: Secondary | ICD-10-CM

## 2023-01-20 DIAGNOSIS — Z3A00 Weeks of gestation of pregnancy not specified: Secondary | ICD-10-CM

## 2023-01-20 DIAGNOSIS — O009 Unspecified ectopic pregnancy without intrauterine pregnancy: Secondary | ICD-10-CM | POA: Diagnosis not present

## 2023-01-20 NOTE — Progress Notes (Signed)
Subjective:     Denise Clark is a 32 y.o. female who presents to the clinic 3 weeks status post laparoscopy for  ectopic pregnancy . Eating a regular diet without difficulty. Bowel movements are normal. The patient is not having any pain.  The following portions of the patient's history were reviewed and updated as appropriate: allergies, current medications, past family history, past medical history, past social history, past surgical history, and problem list.  Review of Systems She was bitten on the left leg by a dog yesterday and needs f/u    Objective:    BP 123/76   Pulse 88   Wt 179 lb 11.2 oz (81.5 kg)   LMP 11/20/2022 (Exact Date)   BMI 30.85 kg/m  General:  alert, cooperative, and no distress  Abdomen: soft, bowel sounds active, non-tender  Incision:   healing well, no drainage, no erythema, no hernia, no seroma, no swelling, no dehiscence, incision well approximated    Bite mark on left thigh not inflamed or tender Assessment:    Doing well postoperatively. Operative findings again reviewed. Pathology report discussed.    Plan:    1. Continue any current medications. 2. Wound care discussed. 3. Activity restrictions: none 4. Anticipated return to work: now 5. Follow up: 3 weeks for annual and Nexplanon. She needs to go to urgent care due to the dog bite   Adam Phenix, MD 01/20/2023

## 2023-02-26 ENCOUNTER — Ambulatory Visit: Payer: Medicaid Other | Admitting: Obstetrics and Gynecology

## 2023-03-19 ENCOUNTER — Ambulatory Visit (INDEPENDENT_AMBULATORY_CARE_PROVIDER_SITE_OTHER): Payer: MEDICAID | Admitting: Obstetrics and Gynecology

## 2023-03-19 ENCOUNTER — Encounter: Payer: Self-pay | Admitting: Obstetrics and Gynecology

## 2023-03-19 ENCOUNTER — Other Ambulatory Visit (HOSPITAL_COMMUNITY)
Admission: RE | Admit: 2023-03-19 | Discharge: 2023-03-19 | Disposition: A | Discharge status: Home / Self Care | Payer: MEDICAID | Source: Ambulatory Visit | Attending: Obstetrics and Gynecology | Admitting: Obstetrics and Gynecology

## 2023-03-19 VITALS — BP 138/64 | HR 92 | Wt 180.2 lb

## 2023-03-19 DIAGNOSIS — Z113 Encounter for screening for infections with a predominantly sexual mode of transmission: Secondary | ICD-10-CM | POA: Insufficient documentation

## 2023-03-19 DIAGNOSIS — Z124 Encounter for screening for malignant neoplasm of cervix: Secondary | ICD-10-CM | POA: Diagnosis not present

## 2023-03-19 DIAGNOSIS — Z8759 Personal history of other complications of pregnancy, childbirth and the puerperium: Secondary | ICD-10-CM | POA: Diagnosis not present

## 2023-03-19 DIAGNOSIS — Z30011 Encounter for initial prescription of contraceptive pills: Secondary | ICD-10-CM

## 2023-03-19 DIAGNOSIS — Z0000 Encounter for general adult medical examination without abnormal findings: Secondary | ICD-10-CM | POA: Diagnosis not present

## 2023-03-19 MED ORDER — NORETHINDRONE 0.35 MG PO TABS
1.0000 | ORAL_TABLET | Freq: Every day | ORAL | 11 refills | Status: AC
Start: 2023-03-19 — End: ?

## 2023-03-19 NOTE — Progress Notes (Signed)
ANNUAL EXAM Patient name: Denise Clark MRN [id number]  Date of birth: 01/08/1991 Chief Complaint:   Gynecologic Exam and Contraception  History of Present Illness:   Denise Clark is a 32 y.o. G2P1011 being seen today for a routine annual exam.  Current complaints: annual, STI testing  Wants OCPs instead of nexplanon Got pregnant on nuvaring Had nexplanon previously for 3 full years Had depo - significant weight gain IUD - BF wants another child and upset that she wants to be on contracpetion  Current tobacco use and migraines w/o aura  Vaginal discharge, white d/c w/ a slight odor which is atypical for her. No recent antibiotic use.   Menstrual concerns? No   Breast or nipple changes? No  Contraception use? No  Sexually active? Yes   Patient's last menstrual period was 03/03/2023 (approximate).   The pregnancy intention screening data noted above was reviewed. Potential methods of contraception were discussed. The patient elected to proceed with No data recorded.   Last pap No results found for: "DIAGPAP", "HPVHIGH", "ADEQPAP" 2018 NILM Last mammogram: n/a Last colonoscopy: n/a      01/20/2023   11:34 AM 01/01/2023    5:20 PM  Depression screen PHQ 2/9  Decreased Interest 0 0  Down, Depressed, Hopeless 0 1  PHQ - 2 Score 0 1  Altered sleeping 0 0  Tired, decreased energy 1 1  Change in appetite 0 0  Feeling bad or failure about yourself  0 0  Trouble concentrating 0 0  Moving slowly or fidgety/restless 0 1  Suicidal thoughts 0 0  PHQ-9 Score 1 3        01/20/2023   11:34 AM 01/01/2023    5:20 PM  GAD 7 : Generalized Anxiety Score  Nervous, Anxious, on Edge 0 0  Control/stop worrying 0 0  Worry too much - different things 0 0  Trouble relaxing 0 0  Restless 0 0  Easily annoyed or irritable 0 0  Afraid - awful might happen 0 0  Total GAD 7 Score 0 0     Review of Systems:   Pertinent items are noted in HPI Denies any headaches, blurred vision,  fatigue, shortness of breath, chest pain, abdominal pain, abnormal vaginal discharge/itching/odor/irritation, problems with periods, bowel movements, urination, or intercourse unless otherwise stated above. Pertinent History Reviewed:  Reviewed past medical,surgical, social and family history.  Reviewed problem list, medications and allergies. Physical Assessment:   Vitals:   03/19/23 1544  BP: 138/64  Pulse: 92  Weight: 180 lb 3.2 oz (81.7 kg)  Body mass index is 30.93 kg/m.        Physical Examination:   General appearance - well appearing, and in no distress  Mental status - alert, oriented to person, place, and time  Psych:  She has a normal mood and affect  Skin - warm and dry, normal color, no suspicious lesions noted  Chest - effort normal  Abdomen - soft, nontender, nondistended, no masses or organomegaly  Pelvic -  VULVA: normal appearing vulva with no masses, tenderness or lesions   VAGINA: normal appearing vagina with normal color and discharge, no lesions   CERVIX: normal appearing cervix without discharge or lesions, no CMT  Thin prep pap is done with HR HPV cotesting  UTERUS: uterus is felt to be normal size, shape, consistency and nontender   ADNEXA: No adnexal masses or tenderness noted.  Extremities:  No swelling or varicosities noted  Chaperone present for exam  No results found for this or any previous visit (from the past 24 hour(s)).    Assessment & Plan:  1. Screening for cervical cancer Routine pap collected - Cytology - PAP  2. Routine screening for STI (sexually transmitted infection) Requested STI serum and vaginal swabs - left prior to collecting serum test; order placed for future collection  - Cervicovaginal ancillary only - RPR+HBsAg+HCVAb+...; Future  3. Encounter for initial prescription of contraceptive pills Discussed options for contraception and has not completed childbearing but not ready to concieve at this time. Has history of  migraines without aura and current tobacco use - discussed VTE risk with concomitant tobacco use. Given risks, will proceed with POPs to start, emphasized importance of taking daily at the same time.  - norethindrone (MICRONOR) 0.35 MG tablet; Take 1 tablet (0.35 mg total) by mouth daily.  Dispense: 28 tablet; Refill: 11  4. 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 Prior ectopic pregnancy was on the same side as the cornual. Noted that prior pregnancy at increased risk of complication given surgery at cornua of the uterus.  - Beta hCG quant (ref lab); Future   Orders Placed This Encounter  Procedures   RPR+HBsAg+HCVAb+...   Beta hCG quant (ref lab)    Meds:  Meds ordered this encounter  Medications   norethindrone (MICRONOR) 0.35 MG tablet    Sig: Take 1 tablet (0.35 mg total) by mouth daily.    Dispense:  28 tablet    Refill:  11    Follow-up: No follow-ups on file.  Lorriane Shire, MD 03/19/2023 3:56 PM

## 2023-03-21 ENCOUNTER — Other Ambulatory Visit: Payer: Self-pay | Admitting: Obstetrics and Gynecology

## 2023-03-21 DIAGNOSIS — B9689 Other specified bacterial agents as the cause of diseases classified elsewhere: Secondary | ICD-10-CM

## 2023-03-21 DIAGNOSIS — A599 Trichomoniasis, unspecified: Secondary | ICD-10-CM

## 2023-03-21 LAB — CERVICOVAGINAL ANCILLARY ONLY
Bacterial Vaginitis (gardnerella): POSITIVE — AB
Candida Glabrata: NEGATIVE
Candida Vaginitis: NEGATIVE
Chlamydia: NEGATIVE
Comment: NEGATIVE
Comment: NEGATIVE
Comment: NEGATIVE
Comment: NEGATIVE
Comment: NEGATIVE
Comment: NORMAL
Neisseria Gonorrhea: NEGATIVE
Trichomonas: POSITIVE — AB

## 2023-03-21 LAB — CYTOLOGY - PAP
Comment: NEGATIVE
Diagnosis: NEGATIVE
High risk HPV: NEGATIVE

## 2023-03-21 MED ORDER — METRONIDAZOLE 500 MG PO TABS
500.0000 mg | ORAL_TABLET | Freq: Two times a day (BID) | ORAL | 0 refills | Status: AC
Start: 2023-03-21 — End: 2023-03-28

## 2023-03-24 ENCOUNTER — Telehealth: Payer: Self-pay

## 2023-03-24 NOTE — Telephone Encounter (Addendum)
-----   Message from Lorriane Shire sent at 03/21/2023  1:11 PM EDT ----- Notify pt of trichomonas and BV. RX's sent. Advised of need for partner TX and no unprotected sex for 7 days after TX.   Called pt; VM left stating I am calling with results and requesting a call back.

## 2023-03-26 NOTE — Telephone Encounter (Signed)
Called patient to inform of recent results. Patient did not answer. LM for patient to call the office for recent results and recommendations. Letter created to be mailed today to patient.

## 2023-03-30 ENCOUNTER — Other Ambulatory Visit: Payer: Self-pay

## 2023-04-12 ENCOUNTER — Other Ambulatory Visit (HOSPITAL_COMMUNITY): Payer: Self-pay

## 2023-04-29 ENCOUNTER — Other Ambulatory Visit (HOSPITAL_COMMUNITY): Payer: Self-pay

## 2023-05-19 ENCOUNTER — Other Ambulatory Visit (HOSPITAL_COMMUNITY): Payer: Self-pay

## 2023-09-30 ENCOUNTER — Encounter (HOSPITAL_COMMUNITY): Payer: Self-pay

## 2023-09-30 ENCOUNTER — Emergency Department (HOSPITAL_COMMUNITY)
Admission: EM | Admit: 2023-09-30 | Discharge: 2023-10-05 | Disposition: A | Discharge status: Home / Self Care | Payer: MEDICAID | Attending: Emergency Medicine | Admitting: Emergency Medicine

## 2023-09-30 ENCOUNTER — Other Ambulatory Visit: Payer: Self-pay

## 2023-09-30 DIAGNOSIS — D72829 Elevated white blood cell count, unspecified: Secondary | ICD-10-CM | POA: Diagnosis not present

## 2023-09-30 DIAGNOSIS — F3164 Bipolar disorder, current episode mixed, severe, with psychotic features: Secondary | ICD-10-CM | POA: Insufficient documentation

## 2023-09-30 DIAGNOSIS — R451 Restlessness and agitation: Secondary | ICD-10-CM | POA: Diagnosis not present

## 2023-09-30 DIAGNOSIS — N390 Urinary tract infection, site not specified: Secondary | ICD-10-CM | POA: Diagnosis not present

## 2023-09-30 DIAGNOSIS — F23 Brief psychotic disorder: Secondary | ICD-10-CM | POA: Insufficient documentation

## 2023-09-30 DIAGNOSIS — R4689 Other symptoms and signs involving appearance and behavior: Secondary | ICD-10-CM

## 2023-09-30 DIAGNOSIS — F312 Bipolar disorder, current episode manic severe with psychotic features: Secondary | ICD-10-CM

## 2023-09-30 DIAGNOSIS — F3130 Bipolar disorder, current episode depressed, mild or moderate severity, unspecified: Secondary | ICD-10-CM | POA: Diagnosis not present

## 2023-09-30 LAB — COMPREHENSIVE METABOLIC PANEL
ALT: 50 U/L — ABNORMAL HIGH (ref 0–44)
AST: 66 U/L — ABNORMAL HIGH (ref 15–41)
Albumin: 4.8 g/dL (ref 3.5–5.0)
Alkaline Phosphatase: 63 U/L (ref 38–126)
Anion gap: 16 — ABNORMAL HIGH (ref 5–15)
BUN: 26 mg/dL — ABNORMAL HIGH (ref 6–20)
CO2: 23 mmol/L (ref 22–32)
Calcium: 9.8 mg/dL (ref 8.9–10.3)
Chloride: 103 mmol/L (ref 98–111)
Creatinine, Ser: 0.97 mg/dL (ref 0.44–1.00)
GFR, Estimated: >60 mL/min (ref >60)
Glucose, Bld: 114 mg/dL — ABNORMAL HIGH (ref 70–99)
Potassium: 3.7 mmol/L (ref 3.5–5.1)
Sodium: 142 mmol/L (ref 135–145)
Total Bilirubin: 1.1 mg/dL (ref 0.0–1.2)
Total Protein: 9 g/dL — ABNORMAL HIGH (ref 6.5–8.1)

## 2023-09-30 LAB — URINALYSIS, ROUTINE W REFLEX MICROSCOPIC
Bilirubin Urine: NEGATIVE
Glucose, UA: NEGATIVE mg/dL
Ketones, ur: 20 mg/dL — AB
Nitrite: POSITIVE — AB
Protein, ur: 100 mg/dL — AB
Specific Gravity, Urine: 1.027 (ref 1.005–1.030)
pH: 8 (ref 5.0–8.0)

## 2023-09-30 LAB — CBC
HCT: 37.5 % (ref 36.0–46.0)
Hemoglobin: 12 g/dL (ref 12.0–15.0)
MCH: 29.7 pg (ref 26.0–34.0)
MCHC: 32 g/dL (ref 30.0–36.0)
MCV: 92.8 fL (ref 80.0–100.0)
Platelets: 377 10*3/uL (ref 150–400)
RBC: 4.04 MIL/uL (ref 3.87–5.11)
RDW: 13.5 % (ref 11.5–15.5)
WBC: 12.7 10*3/uL — ABNORMAL HIGH (ref 4.0–10.5)
nRBC: 0 % (ref 0.0–0.2)

## 2023-09-30 LAB — RAPID URINE DRUG SCREEN, HOSP PERFORMED
Amphetamines: NOT DETECTED
Barbiturates: NOT DETECTED
Benzodiazepines: NOT DETECTED
Cocaine: NOT DETECTED
Opiates: NOT DETECTED
Tetrahydrocannabinol: POSITIVE — AB

## 2023-09-30 LAB — ETHANOL: Alcohol, Ethyl (B): <10 mg/dL (ref <10)

## 2023-09-30 LAB — PREGNANCY, URINE: Preg Test, Ur: NEGATIVE

## 2023-09-30 MED ORDER — SODIUM CHLORIDE 0.9 % IV SOLN
1.0000 g | Freq: Once | INTRAVENOUS | Status: AC
  Administered 2023-09-30: 1 g via INTRAVENOUS
  Filled 2023-09-30: qty 10

## 2023-09-30 MED ORDER — ZIPRASIDONE MESYLATE 20 MG IM SOLR
20.0000 mg | Freq: Once | INTRAMUSCULAR | Status: AC
  Administered 2023-09-30: 20 mg via INTRAMUSCULAR
  Filled 2023-09-30: qty 20

## 2023-09-30 MED ORDER — LORAZEPAM 2 MG/ML IJ SOLN
2.0000 mg | Freq: Once | INTRAMUSCULAR | Status: AC
Start: 2023-09-30 — End: 2023-09-30
  Administered 2023-09-30: 2 mg via INTRAMUSCULAR
  Filled 2023-09-30: qty 1

## 2023-09-30 MED ORDER — ZIPRASIDONE MESYLATE 20 MG IM SOLR
10.0000 mg | Freq: Once | INTRAMUSCULAR | Status: AC
  Administered 2023-09-30: 10 mg via INTRAMUSCULAR
  Filled 2023-09-30: qty 20

## 2023-09-30 MED ORDER — CEPHALEXIN 500 MG PO CAPS
500.0000 mg | ORAL_CAPSULE | Freq: Four times a day (QID) | ORAL | Status: AC
  Administered 2023-09-30 – 2023-10-01 (×4): 500 mg via ORAL
  Filled 2023-09-30 (×4): qty 1

## 2023-09-30 NOTE — ED Triage Notes (Signed)
Patient BIB [facility], Brought in for Medical Clearance, and safe keeping. With Chemical engineer.

## 2023-09-30 NOTE — ED Provider Notes (Signed)
[place] EMERGENCY DEPARTMENT AT [facility] Provider Note   CSN: [id number] Arrival date & time: 09/30/23  1052     History  Chief Complaint  Patient presents with  . Medical Clearance    Denise Clark is a 33 y.o. female.  Pt with hx schizophrenia, presents from jail with aggressive, agitated behavior, rapid, non-rational speech/thoughts, in past few days. Pt receiving no meds in jail, and unknown whether taking meds prior to being placed in jail on 1/23. Pt not cooperative w history or exam - level 5 caveat. No report of fevers. No report of trauma/fall. No report of specific inciting event.   The history is provided by the patient, medical records and the police. The history is limited by the condition of the patient.       Home Medications Prior to Admission medications   Medication Sig Start Date End Date Taking? Authorizing Provider  acetaminophen (TYLENOL) 325 MG tablet Take 2 tablets (650 mg total) by mouth every 6 (six) hours as needed. 12/25/22   Myna Hidalgo, DO  cyclobenzaprine (FLEXERIL) 10 MG tablet Take 1 tablet (10 mg total) by mouth every 8 (eight) hours as needed for muscle spasms. Patient not taking: Reported on 03/19/2023 01/10/23   Venora Maples, MD  ferrous gluconate (FERGON) 324 MG tablet Take 1 tablet (324 mg total) by mouth every other day. Patient not taking: Reported on 01/10/2023 12/25/22 02/23/23  Myna Hidalgo, DO  ferrous sulfate 325 (65 FE) MG tablet Take 1 tablet (325 mg total) by mouth every other day. Patient not taking: Reported on 03/19/2023 01/01/23   Adam Phenix, MD  ibuprofen (ADVIL) 600 MG tablet Take 1 tablet (600 mg total) by mouth every 6 (six) hours. 12/26/22   Myna Hidalgo, DO  norethindrone (MICRONOR) 0.35 MG tablet Take 1 tablet (0.35 mg total) by mouth daily. 03/19/23   Lorriane Shire, MD  ondansetron (ZOFRAN) 4 MG tablet Take 1 tablet (4 mg total) by mouth every 6 (six) hours as needed for nausea. Patient  not taking: Reported on 03/19/2023 01/10/23   Venora Maples, MD  traMADol (ULTRAM) 50 MG tablet Take 1-2 tablets (50-100 mg total) by mouth every 6 (six) hours as needed for severe pain. Patient not taking: Reported on 03/19/2023 01/01/23   Adam Phenix, MD      Allergies    Hydrocodone    Review of Systems   Review of Systems  Constitutional:  Negative for fever.  Respiratory:  Negative for cough.   Gastrointestinal:  Negative for vomiting.  Psychiatric/Behavioral:  Positive for agitation.     Physical Exam Updated Vital Signs BP (!) 115/98   Pulse (!) 109   Resp 20   Ht 1.626 m (5\' 4" )   Wt 73.9 kg   SpO2 96%   BMI 27.98 kg/m  Physical Exam Vitals and nursing note reviewed.  Constitutional:      Appearance: She is well-developed.     Comments: Agitated, talking loudly.  HENT:     Head: Atraumatic.     Nose: Nose normal.     Mouth/Throat:     Mouth: Mucous membranes are moist.  Eyes:     General: No scleral icterus.    Conjunctiva/sclera: Conjunctivae normal.     Pupils: Pupils are equal, round, and reactive to light.  Neck:     Trachea: No tracheal deviation.     Comments: Moving neck freely/comfortably in all directions.  Cardiovascular:     Rate  and Rhythm: Tachycardia present.     Pulses: Normal pulses.     Heart sounds: Normal heart sounds. No murmur heard.    No friction rub. No gallop.  Pulmonary:     Effort: Pulmonary effort is normal. No respiratory distress.     Breath sounds: Normal breath sounds.  Abdominal:     General: There is no distension.     Palpations: Abdomen is soft.     Tenderness: There is no abdominal tenderness.  Genitourinary:    Comments: No cva tenderness.  Musculoskeletal:        General: No swelling or tenderness.     Cervical back: Normal range of motion and neck supple. No rigidity. No muscular tenderness.  Skin:    General: Skin is warm and dry.     Findings: No rash.  Neurological:     Mental Status: She is alert.      Comments: Alert, talking loudly, uncooperative. Moves bilateral extremities purposefully with good strength. No cooperative w exam, agitated, repetitively pushes others away whether trying to help w gown or examine.   Psychiatric:     Comments: Agitated, aggressive behavior, talking loudly and intermittently yelling, talking rapidly, switching from one unrelated thought to next, non-rational thoughts.     ED Results / Procedures / Treatments   Labs (all labs ordered are listed, but only abnormal results are displayed) Results for orders placed or performed during the hospital encounter of 09/30/23  Pregnancy, urine   Collection Time: 09/30/23 11:37 AM  Result Value Ref Range   Preg Test, Ur NEGATIVE NEGATIVE  CBC   Collection Time: 09/30/23 11:53 AM  Result Value Ref Range   WBC 12.7 (H) 4.0 - 10.5 K/uL   RBC 4.04 3.87 - 5.11 MIL/uL   Hemoglobin 12.0 12.0 - 15.0 g/dL   HCT 29.5 62.1 - 30.8 %   MCV 92.8 80.0 - 100.0 fL   MCH 29.7 26.0 - 34.0 pg   MCHC 32.0 30.0 - 36.0 g/dL   RDW 65.7 84.6 - 96.2 %   Platelets 377 150 - 400 K/uL   nRBC 0.0 0.0 - 0.2 %  Comprehensive metabolic panel   Collection Time: 09/30/23 11:53 AM  Result Value Ref Range   Sodium 142 135 - 145 mmol/L   Potassium 3.7 3.5 - 5.1 mmol/L   Chloride 103 98 - 111 mmol/L   CO2 23 22 - 32 mmol/L   Glucose, Bld 114 (H) 70 - 99 mg/dL   BUN 26 (H) 6 - 20 mg/dL   Creatinine, Ser 9.52 0.44 - 1.00 mg/dL   Calcium 9.8 8.9 - 84.1 mg/dL   Total Protein 9.0 (H) 6.5 - 8.1 g/dL   Albumin 4.8 3.5 - 5.0 g/dL   AST 66 (H) 15 - 41 U/L   ALT 50 (H) 0 - 44 U/L   Alkaline Phosphatase 63 38 - 126 U/L   Total Bilirubin 1.1 0.0 - 1.2 mg/dL   GFR, Estimated >32 >44 mL/min   Anion gap 16 (H) 5 - 15  Ethanol   Collection Time: 09/30/23 11:53 AM  Result Value Ref Range   Alcohol, Ethyl (B) <10 <10 mg/dL  Urinalysis, Routine w reflex microscopic -Urine, Catheterized   Collection Time: 09/30/23 12:50 PM  Result Value Ref Range    Color, Urine AMBER (A) YELLOW   APPearance TURBID (A) CLEAR   Specific Gravity, Urine 1.027 1.005 - 1.030   pH 8.0 5.0 - 8.0   Glucose, UA NEGATIVE NEGATIVE mg/dL  Hgb urine dipstick MODERATE (A) NEGATIVE   Bilirubin Urine NEGATIVE NEGATIVE   Ketones, ur 20 (A) NEGATIVE mg/dL   Protein, ur 811 (A) NEGATIVE mg/dL   Nitrite POSITIVE (A) NEGATIVE   Leukocytes,Ua SMALL (A) NEGATIVE   RBC / HPF 0-5 0 - 5 RBC/hpf   WBC, UA 21-50 0 - 5 WBC/hpf   Bacteria, UA MANY (A) NONE SEEN   Squamous Epithelial / HPF 0-5 0 - 5 /HPF   WBC Clumps PRESENT    Mucus PRESENT    Amorphous Crystal PRESENT   Rapid urine drug screen (hospital performed)   Collection Time: 09/30/23 12:50 PM  Result Value Ref Range   Opiates NONE DETECTED NONE DETECTED   Cocaine NONE DETECTED NONE DETECTED   Benzodiazepines NONE DETECTED NONE DETECTED   Amphetamines NONE DETECTED NONE DETECTED   Tetrahydrocannabinol POSITIVE (A) NONE DETECTED   Barbiturates NONE DETECTED NONE DETECTED      EKG EKG Interpretation Date/Time:  Tuesday September 30 2023 12:28:51 EST Ventricular Rate:  116 PR Interval:    QRS Duration:  85 QT Interval:  333 QTC Calculation: 463 R Axis:   96  Text Interpretation: Sinus rhythm Artifact Confirmed by Cathren Laine ([id number]) on 09/30/2023 1:12:16 PM  Radiology No results found.  Procedures Procedures    Medications Ordered in ED Medications  cefTRIAXone (ROCEPHIN) 1 g in sodium chloride 0.9 % 100 mL IVPB (has no administration in time range)  ziprasidone (GEODON) injection 10 mg (10 mg Intramuscular Given 09/30/23 1152)    ED Course/ Medical Decision Making/ A&P                                 Medical Decision Making Problems Addressed: Acute psychosis [facility]): acute illness or injury with systemic symptoms that poses a threat to life or bodily functions Acute UTI: acute illness or injury Aggressive behavior: acute illness or injury Agitation: acute illness or injury with systemic  symptoms that poses a threat to life or bodily functions  Amount and/or Complexity of Data Reviewed Independent Historian:     Details: [facility], hx External Data Reviewed: notes. Labs: ordered. Decision-making details documented in ED Course. ECG/medicine tests: ordered and independent interpretation performed. Decision-making details documented in ED Course. Discussion of management or test interpretation with external provider(s): BH team.   Risk Prescription drug management. Parenteral controlled substances. Decision regarding hospitalization.   Iv ns. Continuous pulse ox and cardiac monitoring. Labs ordered/sent.   Differential diagnosis includes acute psychosis, metabolic disturbance, sud, etc. Dispo decision including potential need for admission considered - will get labs and reassess.   Reviewed nursing notes and prior charts for additional history. External reports reviewed. Additional history from: [facility].   Geodon IM given for symptom improvement.   Cardiac monitor: sinus rhythm, rate 110.  Labs reviewed/interpreted by me - wbc 12, hct normal. K normal, cr normal.   Recheck pt, calmer. No distress. Asking for po fluids - provided.   BH team consulted.   The patient has been placed in psychiatric observation due to the need to provide a safe environment for the patient while obtaining psychiatric consultation and evaluation, as well as ongoing medical and medication management to treat the patient's condition.    Dispo per [facility] team.   Ua with 21-50 wbc, urine culture sent. Iv antibiotic given.   CRITICAL CARE RE: acute agitated delirious state/acute psychosis, requiring parenteral therapy Performed by: Suzi Roots Total  critical care time: 40 minutes Critical care time was exclusive of separately billable procedures and treating other patients. Critical care was necessary to treat or prevent imminent or life-threatening deterioration. Critical care was time spent  personally by me on the following activities: development of treatment plan with patient and/or surrogate as well as nursing, discussions with consultants, evaluation of patient's response to treatment, examination of patient, obtaining history from patient or surrogate, ordering and performing treatments and interventions, ordering and review of laboratory studies, ordering and review of radiographic studies, pulse oximetry and re-evaluation of patient's condition.   Recheck, pt calmer. Alert. Hr 100. Rr 14. Pulse ox 100%.   BH eval pending, dispo per [facility] team. Pt signed out to oncoming EDP.            Final Clinical Impression(s) / ED Diagnoses Final diagnoses:  Acute psychosis (HCC)  Agitation  Aggressive behavior    Rx / DC Orders ED Discharge Orders     None         Cathren Laine, MD 09/30/23 1521

## 2023-09-30 NOTE — ED Notes (Signed)
Patient ambulated to restroom with officer.

## 2023-09-30 NOTE — ED Notes (Signed)
Patient given bag lunch.

## 2023-09-30 NOTE — ED Notes (Signed)
Patient attempting to use bed pan.

## 2023-09-30 NOTE — ED Notes (Signed)
Per officer, patient come in on 23rd patient was fine and 24 was fine, on the 25 patient began to be altered. Patient has been in a cell alone, needing 2 officers with her at all times. Officer stated Patient had been putting toilet water on her face and refusing food stating "It's Poisoned".

## 2023-09-30 NOTE — BH Assessment (Signed)
TTS Consult will be completed by IRIS. IRIS Coordinator will communicate in established secure chat assessment time and provider name. Thanks

## 2023-10-01 DIAGNOSIS — F3130 Bipolar disorder, current episode depressed, mild or moderate severity, unspecified: Secondary | ICD-10-CM

## 2023-10-01 DIAGNOSIS — F312 Bipolar disorder, current episode manic severe with psychotic features: Secondary | ICD-10-CM

## 2023-10-01 MED ORDER — BENZTROPINE MESYLATE 1 MG PO TABS: 1.0000 mg | ORAL_TABLET | Freq: Two times a day (BID) | ORAL | Status: DC | PRN

## 2023-10-01 MED ORDER — BENZTROPINE MESYLATE 1 MG/ML IJ SOLN: 1.0000 mg | Freq: Two times a day (BID) | INTRAMUSCULAR | Status: DC | PRN

## 2023-10-01 MED ORDER — HALOPERIDOL 5 MG PO TABS
5.0000 mg | ORAL_TABLET | Freq: Four times a day (QID) | ORAL | Status: DC | PRN
  Administered 2023-10-01 – 2023-10-03 (×4): 5 mg via ORAL
  Filled 2023-10-01 (×4): qty 1

## 2023-10-01 MED ORDER — DIPHENHYDRAMINE HCL 25 MG PO CAPS
50.0000 mg | ORAL_CAPSULE | Freq: Four times a day (QID) | ORAL | Status: DC | PRN
Start: 2023-10-01 — End: 2023-10-05
  Administered 2023-10-02: 50 mg via ORAL
  Filled 2023-10-01: qty 2

## 2023-10-01 MED ORDER — LORAZEPAM 2 MG/ML IJ SOLN
2.0000 mg | Freq: Four times a day (QID) | INTRAMUSCULAR | Status: DC | PRN
  Administered 2023-10-01 – 2023-10-03 (×2): 2 mg via INTRAMUSCULAR
  Filled 2023-10-01 (×2): qty 1

## 2023-10-01 MED ORDER — LORAZEPAM 1 MG PO TABS
2.0000 mg | ORAL_TABLET | Freq: Four times a day (QID) | ORAL | Status: DC | PRN
  Administered 2023-10-02 – 2023-10-05 (×3): 2 mg via ORAL
  Filled 2023-10-01 (×4): qty 2

## 2023-10-01 MED ORDER — OLANZAPINE 5 MG PO TBDP
5.0000 mg | ORAL_TABLET | Freq: Every day | ORAL | Status: DC
Start: 2023-10-01 — End: 2023-10-04
  Administered 2023-10-01 – 2023-10-03 (×3): 5 mg via ORAL
  Filled 2023-10-01 (×3): qty 1

## 2023-10-01 MED ORDER — DIPHENHYDRAMINE HCL 50 MG/ML IJ SOLN
50.0000 mg | Freq: Four times a day (QID) | INTRAMUSCULAR | Status: DC | PRN
Start: 2023-10-01 — End: 2023-10-05
  Administered 2023-10-01: 50 mg via INTRAMUSCULAR
  Filled 2023-10-01: qty 1

## 2023-10-01 MED ORDER — HALOPERIDOL LACTATE 5 MG/ML IJ SOLN
5.0000 mg | Freq: Four times a day (QID) | INTRAMUSCULAR | Status: DC | PRN
  Administered 2023-10-01: 5 mg via INTRAMUSCULAR
  Filled 2023-10-01: qty 1

## 2023-10-01 NOTE — Consult Note (Signed)
[facility] Consult Note  Patient Name: Denise Clark MRN: [id number] DOB: 06-26-1991 DATE OF Consult: 10/01/2023  PRIMARY PSYCHIATRIC DIAGNOSES  1.  Bipolar I Disorder, Mixed Type, Recurrent with Psychotic Features    RECOMMENDATIONS  Recommendations: Medication recommendations: Patient is quite psychotic and agitated.  Begin Zyprexa Zydis, 5 mg SL at bedtime for psychosis/mood control;  For agitation/aggression, given that still psychotic and agitated after 30 mg Geodon over past 12 hours (and can only give 40 mg/24 hours), and given that cannot use IM Zyprexa with IM Ativan, have written PRN's as Haldol, 5 mg po/IM q6h PRN and Ativan 2 mg po/IM q6h PRN and Benadryl, 50 mg po/IM q6h PRN.  Also, Cogentin 1 mg po/IM bid PRN EPS. Non-Medication/therapeutic recommendations: Patient is acutely agitated and a danger to herself and others. She needs constant observation for safety until she can be safely transferred to a psychiatric facility.  Continue with matter-of-fact emotional support in ED, pending transfer. Is inpatient psychiatric hospitalization recommended for this patient? Yes (Explain why): Patient's thought processes are quite disorganized; she is delusional that she has been attacked by the devil; and she is agitated and physically aggressive toward staff.  She meets inpatient criteria for stabilization with medications, and she meets IVC criteria, given her psychosis and dangerousness.  However, per the [facility] department, patient can be taken to a forensic psychiatric facility under the care of [place] [facility], where she will get evaluated and treated for her psychosis.  Given that, then patient would not need an IVC, but instead can be returned to custody of the [facility] department with the specific recommendation that patient be taken to appropriate facility within the Department of Correction that can provide adequate treatment and supervision of patient's  course. Follow-Up Telepsychiatry C/L services: We will sign off for now. Please re-consult our service if needed for any concerning changes in the patient's condition, discharge planning, or questions. Communication: Treatment team members (and family members if applicable) who were involved in treatment/care discussions and planning, and with whom we spoke or engaged with via secure text/chat, include the following: Secure message sent to Dr. Bernette Mayers and staff, outlining recommendations.  Thank you for involving [place] in the care of this patient. If you have any additional questions or concerns, please call [phone] and ask for the provider on-call.  TELEPSYCHIATRY ATTESTATION & CONSENT  As the provider for this telehealth consult, I attest that I verified the patient's identity using two separate identifiers, introduced myself to the patient, provided my credentials, disclosed my location, and performed this encounter via a HIPAA-compliant, real-time, face-to-face, two-way, interactive audio and video platform and with the full consent and agreement of the patient (or guardian as applicable.)  Patient physical location: [facility] provider physical location: home office in state of [place].  Video start time: 0300h EST  Video end time: 0310h EST   Total time spent in this encounter was 30 minutes, including record review, clinical interview, behavior observations, discussion of impressions and recommendations (including medications and hospitalization), and consultation/communication with relevant parties   IDENTIFYING DATA  Denise Clark is a 33 y.o. year-old female for whom a psychiatric consultation has been ordered by the primary provider. The patient was identified using two separate identifiers.  CHIEF COMPLAINT/REASON FOR CONSULT   I'm fine now.  I've got the Devil off me now, and I'm ready to go home.  I'm not crazy, and I don't need any medication.   HISTORY OF  PRESENT ILLNESS (HPI)  The patient , per Epic, has a long history of psychotic bipolar disorder, and it appears that she has not been recently in treatment. Patient unable to give a coherent history:  quite tangential and loose in her thought processes, delusional (in that she believes that she has been controlled by the devil), with marked mood lability and aggression toward others.  Has been in Sheriff's custody, and at first was manageable, but over past few days has become so disorganized and agitated, she has required constant supervision.   Since being in ED, she has required 30 mg IM Geodon over 12 hours, and still remains quite agitated and psychotic.  UDS is positive for only THC, and BAL negative.  Does appear that is likely responding to internal stimuli.  Has made no suicidal statements, and has made no homicidal statements, but has been physically aggressive.     PAST PSYCHIATRIC HISTORY   Otherwise as per HPI above.  PAST MEDICAL HISTORY  Past Medical History:  Diagnosis Date   Bipolar disorder (HCC)    Ectopic pregnancy    Schizophrenia [facility])      HOME MEDICATIONS  Facility Ordered Medications  Medication   [COMPLETED] ziprasidone (GEODON) injection 10 mg   [COMPLETED] cefTRIAXone (ROCEPHIN) 1 g in sodium chloride 0.9 % 100 mL IVPB   cephALEXin (KEFLEX) capsule 500 mg   [COMPLETED] ziprasidone (GEODON) injection 20 mg   [COMPLETED] LORazepam (ATIVAN) injection 2 mg   haloperidol (HALDOL) tablet 5 mg   Or   haloperidol lactate (HALDOL) injection 5 mg   LORazepam (ATIVAN) tablet 2 mg   Or   LORazepam (ATIVAN) injection 2 mg   diphenhydrAMINE (BENADRYL) capsule 50 mg   Or   diphenhydrAMINE (BENADRYL) injection 50 mg   OLANZapine zydis (ZYPREXA) disintegrating tablet 5 mg   benztropine (COGENTIN) tablet 1 mg   Or   benztropine mesylate (COGENTIN) injection 1 mg   PTA Medications  Medication Sig   acetaminophen (TYLENOL) 325 MG tablet Take 2 tablets (650 mg total) by  mouth every 6 (six) hours as needed.   ibuprofen (ADVIL) 600 MG tablet Take 1 tablet (600 mg total) by mouth every 6 (six) hours.   ferrous sulfate 325 (65 FE) MG tablet Take 1 tablet (325 mg total) by mouth every other day. (Patient not taking: Reported on 03/19/2023)   traMADol (ULTRAM) 50 MG tablet Take 1-2 tablets (50-100 mg total) by mouth every 6 (six) hours as needed for severe pain. (Patient not taking: Reported on 03/19/2023)   ondansetron (ZOFRAN) 4 MG tablet Take 1 tablet (4 mg total) by mouth every 6 (six) hours as needed for nausea. (Patient not taking: Reported on 03/19/2023)   cyclobenzaprine (FLEXERIL) 10 MG tablet Take 1 tablet (10 mg total) by mouth every 8 (eight) hours as needed for muscle spasms. (Patient not taking: Reported on 03/19/2023)   norethindrone (MICRONOR) 0.35 MG tablet Take 1 tablet (0.35 mg total) by mouth daily.     ALLERGIES  Allergies  Allergen Reactions   Hydrocodone Itching    SOCIAL & SUBSTANCE USE HISTORY  Social History   Socioeconomic History   Marital status: Single    Spouse name: Not on file   Number of children: Not on file   Years of education: Not on file   Highest education level: Not on file  Occupational History   Not on file  Tobacco Use   Smoking status: Some Days    Current packs/day: 0.05  Types: Cigars, Cigarettes   Smokeless tobacco: Never  Vaping Use   Vaping status: Never Used  Substance and Sexual Activity   Alcohol use: No   Drug use: No   Sexual activity: Yes    Birth control/protection: None  Other Topics Concern   Not on file  Social History Narrative   Not on file   Social Drivers of Health   Financial Resource Strain: Not on file  Food Insecurity: Not on file  Transportation Needs: Not on file  Physical Activity: Not on file  Stress: Not on file  Social Connections: Not on file   Social History   Tobacco Use  Smoking Status Some Days   Current packs/day: 0.05   Types: Cigars, Cigarettes   Smokeless Tobacco Never   Social History   Substance and Sexual Activity  Alcohol Use No   Social History   Substance and Sexual Activity  Drug Use No    Additional pertinent information As above, patient is in Sheriff's custody, and arrangements are being made for transfer to a forensic psychiatric treatment facility in [place] .  FAMILY HISTORY  History reviewed. No pertinent family history. Family Psychiatric History (if known):  Unable to give information, given level of psychosis and agitation.   MENTAL STATUS EXAM (MSE)  Mental Status Exam: General Appearance: Neat  Orientation:  Other:  Patient so agitated, unable to be formally assessed.  However, patient is aware that is in the hospital, and she is responding to external environment appropriately  Memory:  Immediate;   No apparent gaps Recent;   No apparent gaps  Concentration:  Concentration: Poor and Attention Span: Poor  Recall:   Patient unable to cooperate with standard bedside testing, given level of agitation  Attention  Poor  Eye Contact:  Poor  Speech:  Pressured and rapid  Language:  Good  Volume:  Increased  Mood: I'm fine and I don't need to be in a hospital.  I don't have any problems now  Affect:  Inappropriate and Labile  Thought Process:  Disorganized and Descriptions of Associations: Loose  Thought Content:  Delusions, Hallucinations: Responding to internal stimuli, Paranoid Ideation, Rumination, and Tangential  Suicidal Thoughts:  No  Homicidal Thoughts:   No actual threats, but aggressive and agitated toward staff in ED  Judgement:  Impaired  Insight:  Lacking  Psychomotor Activity:  Increased and Restlessness  Akathisia:  No  Fund of Knowledge:   Unable to assess secondary to agitation and psychosis/disorganized thinking    Assets:  Transportation Others:  Access  to psychiatric care within the correctional system  Cognition:  WNL  ADL's:  Intact  AIMS (if indicated):       VITALS   Blood pressure 111/70, pulse 100, temperature 98.7 F (37.1 C), temperature source Oral, resp. rate 19, height 5\' 4"  (1.626 m), weight 73.9 kg, SpO2 98%.  LABS  Admission on 09/30/2023  Component Date Value Ref Range Status   WBC 09/30/2023 12.7 (H)  4.0 - 10.5 K/uL Final   RBC 09/30/2023 4.04  3.87 - 5.11 MIL/uL Final   Hemoglobin 09/30/2023 12.0  12.0 - 15.0 g/dL Final   HCT 16/06/9603 37.5  36.0 - 46.0 % Final   MCV 09/30/2023 92.8  80.0 - 100.0 fL Final   MCH 09/30/2023 29.7  26.0 - 34.0 pg Final   MCHC 09/30/2023 32.0  30.0 - 36.0 g/dL Final   RDW 54/05/8118 13.5  11.5 - 15.5 % Final   Platelets 09/30/2023 377  150 - 400 K/uL Final   nRBC 09/30/2023 0.0  0.0 - 0.2 % Final   Performed at [facility], [address], [place], [place] [address]   Sodium 09/30/2023 142  135 - 145 mmol/L Final   Potassium 09/30/2023 3.7  3.5 - 5.1 mmol/L Final   Chloride 09/30/2023 103  98 - 111 mmol/L Final   CO2 09/30/2023 23  22 - 32 mmol/L Final   Glucose, Bld 09/30/2023 114 (H)  70 - 99 mg/dL Final   Glucose reference range applies only to samples taken after fasting for at least 8 hours.   BUN 09/30/2023 26 (H)  6 - 20 mg/dL Final   Creatinine, Ser 09/30/2023 0.97  0.44 - 1.00 mg/dL Final   Calcium 07/37/1062 9.8  8.9 - 10.3 mg/dL Final   Total Protein 69/48/5462 9.0 (H)  6.5 - 8.1 g/dL Final   Albumin 70/35/0093 4.8  3.5 - 5.0 g/dL Final   AST 81/82/9937 66 (H)  15 - 41 U/L Final   ALT 09/30/2023 50 (H)  0 - 44 U/L Final   Alkaline Phosphatase 09/30/2023 63  38 - 126 U/L Final   Total Bilirubin 09/30/2023 1.1  0.0 - 1.2 mg/dL Final   GFR, Estimated 09/30/2023 >60  >60 mL/min Final   Comment: (NOTE) Calculated using the CKD-EPI Creatinine Equation (2021)    Anion gap 09/30/2023 16 (H)  5 - 15 Final   Performed at [facility], [address], [place], [place] [address]   Color, Urine 09/30/2023 AMBER (A)  YELLOW Final   BIOCHEMICALS MAY BE AFFECTED BY COLOR   APPearance 09/30/2023 TURBID  (A)  CLEAR Final   Specific Gravity, Urine 09/30/2023 1.027  1.005 - 1.030 Final   pH 09/30/2023 8.0  5.0 - 8.0 Final   Glucose, UA 09/30/2023 NEGATIVE  NEGATIVE mg/dL Final   Hgb urine dipstick 09/30/2023 MODERATE (A)  NEGATIVE Final   Bilirubin Urine 09/30/2023 NEGATIVE  NEGATIVE Final   Ketones, ur 09/30/2023 20 (A)  NEGATIVE mg/dL Final   Protein, ur 89/38/1017 100 (A)  NEGATIVE mg/dL Final   Nitrite 51/10/5850 POSITIVE (A)  NEGATIVE Final   Leukocytes,Ua 09/30/2023 SMALL (A)  NEGATIVE Final   RBC / HPF 09/30/2023 0-5  0 - 5 RBC/hpf Final   WBC, UA 09/30/2023 21-50  0 - 5 WBC/hpf Final   Bacteria, UA 09/30/2023 MANY (A)  NONE SEEN Final   Squamous Epithelial / HPF 09/30/2023 0-5  0 - 5 /HPF Final   WBC Clumps 09/30/2023 PRESENT   Final   Mucus 09/30/2023 PRESENT   Final   Amorphous Crystal 09/30/2023 PRESENT   Final   Performed at [facility], [address], [place], [place] [address]   Opiates 09/30/2023 NONE DETECTED  NONE DETECTED Final   Cocaine 09/30/2023 NONE DETECTED  NONE DETECTED Final   Benzodiazepines 09/30/2023 NONE DETECTED  NONE DETECTED Final   Amphetamines 09/30/2023 NONE DETECTED  NONE DETECTED Final   Tetrahydrocannabinol 09/30/2023 POSITIVE (A)  NONE DETECTED Final   Barbiturates 09/30/2023 NONE DETECTED  NONE DETECTED Final   Comment: (NOTE) DRUG SCREEN FOR MEDICAL PURPOSES ONLY.  IF CONFIRMATION IS NEEDED FOR ANY PURPOSE, NOTIFY LAB WITHIN 5 DAYS.  LOWEST DETECTABLE LIMITS FOR URINE DRUG SCREEN Drug Class                     Cutoff (ng/mL) Amphetamine and metabolites    1000 Barbiturate and metabolites    200 Benzodiazepine  200 Opiates and metabolites        300 Cocaine and metabolites        300 THC                            50 Performed at [facility], [address], [place], [place] [address]    Preg Test, Ur 09/30/2023 NEGATIVE  NEGATIVE Final   Comment:        THE SENSITIVITY OF THIS METHODOLOGY IS >25 mIU/mL. Performed at  [facility], [address] [place], [place] [address]    Alcohol, Ethyl (B) 09/30/2023 <10  <10 mg/dL Final   Comment: (NOTE) Lowest detectable limit for serum alcohol is 10 mg/dL.  For medical purposes only. Performed at [facility], [address] [place], [place] [address]     PSYCHIATRIC REVIEW OF SYSTEMS (ROS)  ROS: Notable for the following relevant positive findings: Review of Systems  Constitutional: Negative.   HENT: Negative.    Eyes: Negative.   Respiratory: Negative.    Cardiovascular: Negative.   Gastrointestinal: Negative.   Genitourinary: Negative.   Musculoskeletal: Negative.   Skin: Negative.   Neurological: Negative.   Endo/Heme/Allergies: Negative.   Psychiatric/Behavioral:  Positive for hallucinations. The patient is nervous/anxious.     Additional findings:      Musculoskeletal: No abnormal movements observed      Gait & Station: Laying/Sitting      Pain Screening: Denies      Nutrition & Dental Concerns: Reviewed  RISK FORMULATION/ASSESSMENT  Is the patient experiencing any suicidal or homicidal ideations: No       Explain if yes: However, patient's thoughts are quite disorganized, and she has been aggressive to others in actions, and she is thus a danger to others because of her psychosis.  Protective factors considered for safety management:   Patient has not responding to verbal calming, both in ED and in police custody, and it does not appear that she has adequate support in community to manage behavior  Risk factors/concerns considered for safety management:  Impulsivity Aggression Isolation Unwillingness to seek help Unmarried  Is there a safety management plan with the patient and treatment team to minimize risk factors and promote protective factors: No           Explain: Patient is quite agitated and in need of acute treatment before can be safely released to Sheriff's custody, given her dangerousness and psychosis.  Is crisis care  placement or psychiatric hospitalization recommended: Yes     Based on my current evaluation and risk assessment, patient is determined at this time to be at:  High risk  *RISK ASSESSMENT Risk assessment is a dynamic process; it is possible that this patient's condition, and risk level, may change. This should be re-evaluated and managed over time as appropriate. Please re-consult psychiatric consult services if additional assistance is needed in terms of risk assessment and management. If your team decides to discharge this patient, please advise the patient how to best access emergency psychiatric services, or to call 911, if their condition worsens or they feel unsafe in any way.   Ezekiel Slocumb, MD Telepsychiatry Consult Services

## 2023-10-01 NOTE — ED Notes (Signed)
Pt continues to have hallucinations, yelling, thrashing around in bed trying to take restraints off. Unable to redirect pt. Officer at bedside

## 2023-10-01 NOTE — ED Notes (Signed)
TTS in progress

## 2023-10-01 NOTE — ED Notes (Signed)
Pt ambulated to restroom.

## 2023-10-01 NOTE — ED Notes (Addendum)
Pt getting agitated, yelling at staff "I'm Trumps grand daughter you have to let me go home". Officer at beside.

## 2023-10-01 NOTE — ED Notes (Addendum)
Pt yelling, thrashing around in bed, wanting to leave. Unable to redirect pt. Officer at bedside.

## 2023-10-01 NOTE — ED Provider Notes (Addendum)
0230 Patient became agitated earlier in the day and was given Geodon and Ativan but then unable to participate in TTS evaluation. She is now awake and yelling out repeatedly. Trying to get out of bed and so placed in hand/wrist cuffs by [facility]. We will try to get TTS evaluation done while she is awake to facilitate her disposition.   3:10 AM Patient seen by Psychiatry who recommend admission. They have ordered additional sedation for her behavior.     3:28 AM I spoke with the Sergeant with the [facility] office who states the patient is scheduled to go a 'Safe Keeping Facility' in [place] in the morning which she indicates is equivalent to an inpatient psychiatric facility. I am not familiar with this facility, will defer to the day-time TTS team regarding this as an option for her dispo.     Pollyann Savoy, MD 10/01/23 [phone]

## 2023-10-01 NOTE — ED Notes (Signed)
Pt becoming agitated with sitter and law enforcement and began screaming

## 2023-10-01 NOTE — Progress Notes (Signed)
LCSW Progress Note  [id number]   Denise Clark  10/01/2023  11:40 AM  Description:   Inpatient Psychiatric Referral  Patient was recommended inpatient per [name]. There are no available beds at [facility], per [facility] Rona Ravens RN. Patient was referred to the following out of network facilities:    Destination  Service Provider Address Phone Fax  [facility] [place] [address] [phone]  [facility] [address], [place] [address] [phone]  [facility] [address] [place] [address] [phone]  [facility] [address] [place] [address] [phone]  [facility] [address] [place], [place] [address] [phone]  [facility] [address] [place] [address] [phone]  [facility] [address] [place] [address] [phone]  [facility] [address] [place] [address] [phone]  [facility] [address] [place] [address] [phone]  [facility] [place] [phone]  [facility] EFAX [address] [place], [place] [phone]  [facility] [address] [place] [address] [phone]  [facility] [address] [place] [address] [phone]      Situation ongoing, CSW to continue following and update chart as more information becomes available.      [place] Burech Mcfarland, MSW, LCSW  10/01/2023 11:40 AM

## 2023-10-02 LAB — URINE CULTURE: Culture: >=100000 — AB

## 2023-10-02 MED ORDER — CEPHALEXIN 500 MG PO CAPS
500.0000 mg | ORAL_CAPSULE | Freq: Four times a day (QID) | ORAL | Status: DC
  Administered 2023-10-03 – 2023-10-05 (×11): 500 mg via ORAL
  Filled 2023-10-02 (×11): qty 1

## 2023-10-02 NOTE — ED Notes (Signed)
Patient becoming increasing agitated.  Continues to scream at staff and officer.  Pulling at forensic restraint.  Statistician continuously redirecting patient.  PRN medication given per consult recommendations/order, for patient's safety and severe agitation, and staff safety.  Will continue to monitor

## 2023-10-02 NOTE — ED Notes (Signed)
Pt received breakfast tray

## 2023-10-02 NOTE — Progress Notes (Signed)
LCSW Progress Note  [id number]   [name]  10/02/2023  1:54 AM    Inpatient Behavioral Health Placement  Pt meets inpatient criteria per Denise Slocumb, MD Telepsychiatry Consult Services. There are no available beds within [facility] per [facility] Denise Ravens, RN. Referral was sent to the following facilities;   Destination  Service Provider Address Phone Fax  [facility] [place] [address] [phone]  [facility] [address] [place] [address] [phone]  [facility] [address] [place] [address] [phone]  [facility] [address] [place] [address] [phone]  [facility] [address] [place], [place] [address] [phone]  [facility] [address] [place] [address] [phone]  [facility] [address], [place] [address] [phone]  [facility] [address] [place] [address] [phone]  [facility] [address], [place] [address] [phone]  [facility] [place] [phone]  [facility] EFAX [address] [place], [place] [phone]  [facility] [address], [place] [address] [phone]  [facility] [address] [place] [address] [phone]    Situation ongoing,  CSW will follow up.    Denise Clark, MSW, LCSWA 10/02/2023 1:54 AM

## 2023-10-02 NOTE — ED Provider Notes (Signed)
Emergency Medicine Observation Re-evaluation Note  Denise Clark is a 33 y.o. female, seen on rounds today.  Pt initially presented to the ED for complaints of Medical Clearance Currently, the patient is resting.  Physical Exam  BP 114/76 (BP Location: Right Arm)   Pulse 81   Temp 98.1 F (36.7 C) (Oral)   Resp 18   Ht 5\' 4"  (1.626 m)   Wt 73.9 kg   SpO2 98%   BMI 27.98 kg/m  Physical Exam General: No acute distress Cardiac: Well-perfused Lungs: Nonlabored Psych: Cooperative  ED Course / MDM  EKG:EKG Interpretation Date/Time:  Tuesday September 30 2023 12:28:51 EST Ventricular Rate:  116 PR Interval:    QRS Duration:  85 QT Interval:  333 QTC Calculation: 463 R Axis:   96  Text Interpretation: Sinus rhythm Artifact Confirmed by Cathren Laine ([id number]) on 09/30/2023 1:12:16 PM  I have reviewed the labs performed to date as well as medications administered while in observation.  Recent changes in the last 24 hours include social work working on placement.  Plan  Current plan is for inpatient psychiatric care.  Also being treated for a UTI.    Terrilee Files, MD 10/02/23 [phone]

## 2023-10-02 NOTE — ED Notes (Signed)
Pt received lunch tray

## 2023-10-03 MED ORDER — FLUORESCEIN SODIUM 1 MG OP STRP
1.0000 | ORAL_STRIP | Freq: Once | OPHTHALMIC | Status: AC
  Administered 2023-10-03: 1 via OPHTHALMIC
  Filled 2023-10-03: qty 1

## 2023-10-03 MED ORDER — CIPROFLOXACIN HCL 0.3 % OP SOLN
1.0000 [drp] | OPHTHALMIC | Status: DC
  Administered 2023-10-03 – 2023-10-05 (×8): 1 [drp] via OPHTHALMIC
  Filled 2023-10-03: qty 2.5

## 2023-10-03 MED ORDER — ACETAMINOPHEN 325 MG PO TABS
650.0000 mg | ORAL_TABLET | Freq: Once | ORAL | Status: AC
  Administered 2023-10-03: 650 mg via ORAL
  Filled 2023-10-03: qty 2

## 2023-10-03 NOTE — ED Notes (Signed)
Pt agitated, requiring Public relations account executive and [facility] [employer]. Verbal PAST attempts by corrections and RN were unsuccessful.  Pt unable to comprehend responses to her concerns expressed to the correctional officer including what her "charges are." Pt made verbal aggression and threats. 3 RN's discussed all risk factors with corrections and decided Ativan was necessary for the safety of the patient and staff.

## 2023-10-03 NOTE — ED Provider Notes (Signed)
Patient complains of drainage from her right eye.  Physical exam patient has mild discharge in her right eye.  Conjunctiva mildly inflamed.  Fluorescein is negative.  Patient with conjunctivitis to right eye.  She will be given Cipro drops for 3 days.   Bethann Berkshire, MD 10/03/23 1840

## 2023-10-03 NOTE — ED Notes (Signed)
Mid day vitals held at this time due to pt increasing agitation.

## 2023-10-03 NOTE — ED Notes (Signed)
Pt showing signs of increasing agitation. Pt heard being argumentative with Public relations account executive.

## 2023-10-03 NOTE — ED Notes (Signed)
Provider requested at bedside for c/o pain, itching, and watering of the right eye

## 2023-10-03 NOTE — ED Notes (Signed)
Pt awake now, ambulatory to bathroom in shackles with sheriff and sitter present at pt side.

## 2023-10-03 NOTE — ED Provider Notes (Signed)
Emergency Medicine Observation Re-evaluation Note  Denise Clark is a 33 y.o. female, seen on rounds today.  Pt initially presented to the ED for complaints of Medical Clearance Currently, the patient is resting.  Physical Exam  BP 128/88 (BP Location: Right Arm)   Pulse 80   Temp 98.8 F (37.1 C) (Oral)   Resp 16   Ht 5\' 4"  (1.626 m)   Wt 73.9 kg   SpO2 100%   BMI 27.98 kg/m  Physical Exam General: No acute distress Cardiac: Well-perfused Lungs: Nonlabored Psych: Cooperative  ED Course / MDM  EKG:EKG Interpretation Date/Time:  Tuesday September 30 2023 12:28:51 EST Ventricular Rate:  116 PR Interval:    QRS Duration:  85 QT Interval:  333 QTC Calculation: 463 R Axis:   96  Text Interpretation: Sinus rhythm Artifact Confirmed by Cathren Laine ([id number]) on 09/30/2023 1:12:16 PM  I have reviewed the labs performed to date as well as medications administered while in observation.  Recent changes in the last 24 hours include social work working on placement.  -Urine culture resulted on 1/28 with E coli and Proteus mirabilis. Both are pansensitive, except P mirabilis resistant to macrobid. She has been receiving keflex.   Plan  Current plan is for inpatient psychiatric care.  Also being treated for a UTI.     Loetta Rough, MD 10/03/23 [phone]

## 2023-10-03 NOTE — ED Notes (Signed)
Pt sleeping at this time- will give tylenol if pt wakes up and requests.

## 2023-10-03 NOTE — ED Notes (Signed)
Pt told sitter she wants something for headache- Dr Estell Harpin made aware.

## 2023-10-03 NOTE — ED Notes (Signed)
Called AC to request drops for patient

## 2023-10-03 NOTE — ED Notes (Signed)
Pt requested a shower.

## 2023-10-04 DIAGNOSIS — F312 Bipolar disorder, current episode manic severe with psychotic features: Secondary | ICD-10-CM

## 2023-10-04 DIAGNOSIS — N390 Urinary tract infection, site not specified: Secondary | ICD-10-CM

## 2023-10-04 MED ORDER — OLANZAPINE 5 MG PO TBDP: 5.0000 mg | ORAL_TABLET | Freq: Two times a day (BID) | ORAL | Status: DC

## 2023-10-04 MED ORDER — OLANZAPINE 5 MG PO TBDP
5.0000 mg | ORAL_TABLET | Freq: Two times a day (BID) | ORAL | Status: DC
  Administered 2023-10-04: 5 mg via ORAL
  Filled 2023-10-04: qty 1

## 2023-10-04 MED ORDER — OLANZAPINE 5 MG PO TBDP
10.0000 mg | ORAL_TABLET | Freq: Every day | ORAL | Status: DC
  Administered 2023-10-04: 10 mg via ORAL
  Filled 2023-10-04: qty 2

## 2023-10-04 NOTE — ED Notes (Signed)
 Patient ambulated to restroom.

## 2023-10-04 NOTE — Progress Notes (Signed)
Patient has been denied by [facility] due to no appropriate beds available. Patient meets BH inpatient criteria per Eligha Bridegroom, NP. Patient has been faxed out to the following facilities:   [facility] [place] [address] [phone]  [facility] [address], [place] [address] [phone]  [facility] [address] [place] [address] [phone]  [facility] [address] [place] [address] [phone]  [facility] [address] [place], [place] [address] [phone]  [facility] [address] [place] [address] [phone]  [facility] [address], [place] [address] [phone]  [facility] [address] [place] [address] [phone]  [facility] [address] [place] [address] [phone]  [facility] [place] [phone]  [facility] EFAX [address] [place], [place] [phone]  [facility] [address], [place] [address] [phone]  [facility] [address] [place] [address] [phone]  [facility] [address] [place], [place] [address] [phone]  [facility] [address] [place] [address] [phone]  [facility] [address] [place], [place] [address] [phone]  [facility] [address] [place] [address] [phone]  [facility] [address] [place] [address] [phone]  [facility] [address] [place] [address] [phone]  [facility] [address], [place] [address] [phone]  [facility] [address], [place] [address] [phone]  [facility] [address], [place] [address] [phone]  [facility] [address] [place] [address] [phone]  [facility] [place] [phone]  [facility] [address], [place] [address] [phone]  [facility], [place] [phone]  [facility] [address] [place] [address] [phone]  Damita Dunnings, MSW, LCSW-A  6:13 PM 10/04/2023
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
## 2023-10-04 NOTE — ED Provider Notes (Signed)
Emergency Medicine Observation Re-evaluation Note  Denise Clark is a 33 y.o. female, seen on rounds today.  Pt initially presented to the ED for complaints of Medical Clearance Currently, the patient is sitting in bed appears comfortable. Patient was originally seen from jail with aggressive agitated behavior with rapid speech. Physical Exam  BP 124/84 (BP Location: Left Arm)   Pulse 92   Temp 97.8 F (36.6 C) (Oral)   Resp 17   Ht 1.626 m (5\' 4" )   Wt 73.9 kg   SpO2 100%   BMI 27.98 kg/m  Physical Exam General: No acute distress Cardiac: Regular rate normal blood pressure Lungs: No distress with normal respiratory rate and oxygen sats of 100% Psych: Patient sitting on bed does not appear to be in any acute distress  ED Course / MDM  EKG:EKG Interpretation Date/Time:  Tuesday September 30 2023 12:28:51 EST Ventricular Rate:  116 PR Interval:    QRS Duration:  85 QT Interval:  333 QTC Calculation: 463 R Axis:   96  Text Interpretation: Sinus rhythm Artifact Confirmed by Cathren Laine ([id number]) on 09/30/2023 1:12:16 PM  I have reviewed the labs performed to date as well as medications administered while in observation.  Recent changes in the last 24 hours include patient with a psych evaluation that advises to increase Zyprexa to 10 mg at bedtime and likely diagnosis of bipolar disorder with psychotic features..  Plan  Current plan is for inpatient psychiatric care.    Margarita Grizzle, MD 10/04/23 [phone]

## 2023-10-04 NOTE — ED Notes (Signed)
Nurse spoke with [facility] regarding bed placement for pt.

## 2023-10-04 NOTE — ED Notes (Signed)
 Patient ambulated to the restroom

## 2023-10-04 NOTE — ED Notes (Addendum)
Patient requesting medication for back pain. MD notified.

## 2023-10-04 NOTE — ED Notes (Signed)
Patient listening to music, Meal tray given to patient.

## 2023-10-04 NOTE — ED Notes (Addendum)
Pt given snack cracker pack and ensure per request

## 2023-10-04 NOTE — ED Notes (Signed)
Eligha Bridegroom NP on tele cart with patient.

## 2023-10-05 MED ORDER — ACETAMINOPHEN 325 MG PO TABS
650.0000 mg | ORAL_TABLET | Freq: Once | ORAL | Status: AC
  Administered 2023-10-05: 650 mg via ORAL
  Filled 2023-10-05: qty 2

## 2023-10-05 MED ORDER — OLANZAPINE 10 MG PO TBDP: 10.0000 mg | ORAL_TABLET | Freq: Every day | ORAL | 0 refills | Status: AC

## 2023-10-05 MED ORDER — CEPHALEXIN 500 MG PO CAPS: 500.0000 mg | ORAL_CAPSULE | Freq: Four times a day (QID) | ORAL | 0 refills | Status: DC

## 2023-10-05 MED ORDER — CEPHALEXIN 500 MG PO CAPS: 500.0000 mg | ORAL_CAPSULE | Freq: Four times a day (QID) | ORAL | 0 refills | Status: AC

## 2023-10-05 MED ORDER — OLANZAPINE 10 MG PO TBDP: 10.0000 mg | ORAL_TABLET | Freq: Every day | ORAL | 0 refills | Status: DC

## 2023-10-05 NOTE — ED Provider Notes (Addendum)
Emergency Medicine Observation Re-evaluation Note  Denise Clark is a 33 y.o. female, seen on rounds today.  Pt initially presented to the ED for complaints of Medical Clearance Currently, the patient is sitting in bed, resting comfortably.  Physical Exam  BP 136/80   Pulse 75   Temp 98.4 F (36.9 C) (Oral)   Resp 18   Ht 5\' 4"  (1.626 m)   Wt 73.9 kg   SpO2 100%   BMI 27.98 kg/m  Physical Exam General: No acute distress Cardiac: Regular rate and blood pressure on recent vital signs Lungs: No respiratory distress Psych: Resting comfortably in bed  ED Course / MDM  EKG:EKG Interpretation Date/Time:  Tuesday September 30 2023 12:28:51 EST Ventricular Rate:  116 PR Interval:    QRS Duration:  85 QT Interval:  333 QTC Calculation: 463 R Axis:   96  Text Interpretation: Sinus rhythm Artifact Confirmed by Cathren Laine ([id number]) on 09/30/2023 1:12:16 PM  I have reviewed the labs performed to date as well as medications administered while in observation.  Recent changes in the last 24 hours include none.  Plan  Current plan is for psychiatric placement.  The patient was evaluated by psychiatry.  They have medically cleared/psychiatrically cleared the patient for incarceration.  They do recommend that she stays on her Zyprexa.  The patient is discharged in police custody after being cleared by psychiatry.    Durwin Glaze, MD 10/05/23 Beaulah Dinning    Durwin Glaze, MD 10/05/23 [phone]

## 2023-10-05 NOTE — ED Notes (Signed)
 Pt given breakfast tray

## 2023-10-05 NOTE — Consult Note (Signed)
[facility] Psychiatric Consult follow up  Patient Name: .Denise Clark  MRN: [id number]  DOB: [birth year]  Consult Order details:  Orders (From admission, onward)     Start     Ordered   09/30/23 1826  CONSULT TO CALL ACT TEAM       Ordering Provider: Gloris Manchester, MD  Provider:  (Not yet assigned)  Question:  Reason for Consult?  Answer:  psychosis   09/30/23 1825             Mode of Visit: Tele-visit Virtual Statement:TELE PSYCHIATRY ATTESTATION & CONSENT As the provider for this telehealth consult, I attest that I verified the patient's identity using two separate identifiers, introduced myself to the patient, provided my credentials, disclosed my location, and performed this encounter via a HIPAA-compliant, real-time, face-to-face, two-way, interactive audio and video platform and with the full consent and agreement of the patient (or guardian as applicable.) Patient physical location: [facility]. Telehealth provider physical location: home office in state of [place].   Video start time: 0820 Video end time: 0840    Psychiatry Consult Evaluation  Service Date: October 05, 2023 LOS: 4 days Chief Complaint paranoia, manic behaviors  Primary Psychiatric Diagnoses  Bipolar 1 disorder, current episode manic, with psychotic features 2.  UTI 3.    Assessment  Denise Clark is a 33 y.o. female admitted: Presented to the ED from [facility] on  09/30/2023 10:53 AM for delusions, paranoia, and manic like behavior. She carries the psychiatric diagnoses of bipolar 1 disorder and possible schizophrenia and has a past medical history of  UTI.   Current outpatient psychotropic medications include none and historically she has had a positive response to zyprexa. She was not compliant with medications prior to admission as evidenced by patient report. On initial examination, patient is cooperative, still has pressured speech, labile emotions, and poor insight.. Please see plan below for  detailed recommendations.   Diagnoses:  Active Hospital problems: Principal Problem:   Severe bipolar I disorder, current or most recent episode manic, with psychotic features, with mixed features (HCC)    Plan   ## Psychiatric Medication Recommendations:  - Continue Zyprexa  10 mg at bedtime - Will hold off on mood stabilizer at this time, as patient does have history of medication noncompliance and she has shown significant improvements with Zyprexa monotherapy  ## Medical Decision Making Capacity: Not specifically addressed in this encounter  ## Further Work-up:  -- most recent EKG on 09/30/23 had QtC of 463 -- Pertinent labwork reviewed earlier this admission includes: UDS, CBC, CMP   ## Disposition:--psych cleared for discharge back to [employer] police custody  ## Research scientist (physical sciences) / Environmental: - No specific recommendations at this time.     ## Safety and Observation Level:  - Based on my clinical evaluation, I estimate the patient to be at low risk of self harm in the current setting. - At this time, we recommend  routine. This decision is based on my review of the chart including patient's history and current presentation, interview of the patient, mental status examination, and consideration of suicide risk including evaluating suicidal ideation, plan, intent, suicidal or self-harm behaviors, risk factors, and protective factors. This judgment is based on our ability to directly address suicide risk, implement suicide prevention strategies, and develop a safety plan while the patient is in the clinical setting. Please contact our team if there is a concern that risk level has changed.  CSSR Risk Category:C-SSRS RISK  CATEGORY: No Risk  Suicide Risk Assessment: Patient has following modifiable risk factors for suicide: medication noncompliance, which we are addressing by medication initation. Patient has following non-modifiable or demographic risk factors for suicide:  psychiatric hospitalization Patient has the following protective factors against suicide: Access to outpatient mental health care, Supportive family, Cultural, spiritual, or religious beliefs that discourage suicide, and Frustration tolerance  Thank you for this consult request. Recommendations have been communicated to the primary team.  We will psych clear for discharge back to [facility] police custody at this time.   Denise Bridegroom, NP       History of Present Illness  Relevant Aspects of Hospital ED Course:  Pt with hx schizophrenia, presents from jail with aggressive, agitated behavior, rapid, non-rational speech/thoughts, in past few days. Pt receiving no meds in jail, and unknown whether taking meds prior to being placed in jail on 1/23. Pt not cooperative w history or exam - level 5 caveat. No report of fevers. No report of trauma/fall. No report of specific inciting event.    The history is provided by the patient, medical records and the police. The history is limited by the condition of the patient.    Pt was last hospitalized at [facility] in [facility] and stabilized on Zyprexa 15 mg at bedtime.   Patient Report:  Pt seen at [facility] ED today for psychiatric reevaluation. Pt was originally seen by [facility] probider on 1/29 and was described as tangential, delusional, agitated, poor historian, and difficult to cooperative. Pt was started on Zyprexa 5 mg at bedtime. Pt has been cooperative with scheduled medications, however has still had periods of agitation requiring IM Prn medications.   Today, patient is cooperative and pleasant. She appears to be in no acute distress, speech is normal in rate and tone, appears logical, and has shown significant improvements. She had no acute events yesterday or overnight. Today, pt tells me she was able to sleep very well last night, and feels rested today. She continues to deny SI/HI. She continues to deny AVH. Pt appears logical with improved  insight/judgement. States she is understanding she will need to go back into police custody, but does still have future goals of getting custody of her 72 year old son. She denies any adverse effects from Zyprexa increase. She is agreeable to continue this medication and will send to preferred pharmacy.   Ultimately, patient is no longer meeting [facility] IVC or inpatient treatment criteria. Pt is stable for discharge back to police custody to return to [facility] jail. She will continue Zyprexa 10 mg while in jail. Additional resources have been added to AVS.   Psych ROS:  Depression: denies Anxiety:  denies Mania (lifetime and current): yes current Psychosis: (lifetime and current): yes  Collateral information:  Spoke with [employer] present with patient. She confirms the patient has active charges for trespassing and two accounts of assualt on [employer]. To her knowledge, patient will remain in jail until court hearing/sentencing.   Review of Systems  Psychiatric/Behavioral:         Manic behaviors  All other systems reviewed and are negative.    Psychiatric and Social History  Psychiatric History:  Information collected from patient, chart  Prev Dx/Sx: bipolar 1 disorder Current Psych Provider: none Home Meds (current): noncompliant previouly Previous Med Trials: zyprexa, abilify Therapy: none  Prior Psych Hospitalization: [facility] in 2020  Prior Self Harm: denies Prior Violence: denies   Social History:  Legal Hx:  current charges pending Living Situation: unstable Spiritual Hx: yes Access to weapons/lethal means: denies   Substance History Alcohol: denies  Tobacco: yes Illicit drugs: yes Prescription drug abuse: yes Rehab hx: denies  Exam Findings  Physical Exam:  Vital Signs:  Temp:  [97.8 F (36.6 C)-98.8 F (37.1 C)] 98.4 F (36.9 C) (02/02 0927) Pulse Rate:  [75-112] 112 (02/02 0927) Resp:  [17-20] 18 (02/02 0927) BP:  (124-166)/(80-102) 149/102 (02/02 0927) SpO2:  [98 %-100 %] 98 % (02/02 0927) Blood pressure (!) 149/102, pulse (!) 112, temperature 98.4 F (36.9 C), temperature source Oral, resp. rate 18, height 5\' 4"  (1.626 m), weight 73.9 kg, SpO2 98%. Body mass index is 27.98 kg/m.  Physical Exam Vitals and nursing note reviewed.  Neurological:     Mental Status: She is alert and oriented to person, place, and time.  Psychiatric:        Attention and Perception: Attention normal.        Mood and Affect: Mood is anxious.        Speech: Speech normal.        Behavior: Behavior is cooperative.        Thought Content: Thought content normal.     Mental Status Exam: General Appearance: Fairly Groomed  Orientation:  Full (Time, Place, and Person)  Memory:  Immediate;   Fair Recent;   Fair  Concentration:  Concentration: Fair  Recall:  Fair  Attention  Fair  Eye Contact:  Good  Speech:  normal in rate and tone  Language:  Good  Volume:  Normal  Mood: "feeling good"  Affect:  congruent  Thought Process:  Goal Directed  Thought Content:  intact  Suicidal Thoughts:  No  Homicidal Thoughts:  No  Judgement:  improved  Insight:  improved  Psychomotor Activity:  Normal  Akathisia:  NA  Fund of Knowledge:  Fair      Assets:  Manufacturing systems engineer Desire for Improvement Leisure Time Physical Health Resilience Social Support  Cognition:  WNL  ADL's:  Intact  AIMS (if indicated):        Other History   These have been pulled in through the EMR, reviewed, and updated if appropriate.  Family History:  The patient's family history is not on file.  Medical History: Past Medical History:  Diagnosis Date   Bipolar disorder (HCC)    Ectopic pregnancy    Schizophrenia (HCC)     Surgical History: Past Surgical History:  Procedure Laterality Date   CESAREAN SECTION     DIAGNOSTIC LAPAROSCOPY WITH REMOVAL OF ECTOPIC PREGNANCY N/A 12/24/2022   Procedure: DIAGNOSTIC LAPAROSCOPY WITH  REMOVAL OF ECTOPIC PRENANCY, EVACUATION OF HEMOPERITONEUM;  Surgeon: Myna Hidalgo, DO;  Location: [facility] OR;  Service: Gynecology;  Laterality: N/A;   ECTOPIC PREGNANCY SURGERY       Medications:   Current Facility-Administered Medications:    benztropine (COGENTIN) tablet 1 mg, 1 mg, Oral, BID PRN **OR** benztropine mesylate (COGENTIN) injection 1 mg, 1 mg, Intramuscular, BID PRN, Deaton, Thresa Ross, MD   cephALEXin (KEFLEX) capsule 500 mg, 500 mg, Oral, Q6H, Bethann Berkshire, MD, 500 mg at 10/05/23 0615   ciprofloxacin (CILOXAN) 0.3 % ophthalmic solution 1 drop, 1 drop, Right Eye, Q4H while awake, Bethann Berkshire, MD, 1 drop at 10/05/23 0615   diphenhydrAMINE (BENADRYL) capsule 50 mg, 50 mg, Oral, Q6H PRN, 50 mg at 10/02/23 0406 **OR** diphenhydrAMINE (BENADRYL) injection 50 mg, 50 mg, Intramuscular, Q6H PRN, Ezekiel Slocumb, MD, 50 mg at 10/01/23 [phone]  haloperidol (HALDOL) tablet 5 mg, 5 mg, Oral, Q6H PRN, 5 mg at 10/03/23 1245 **OR** haloperidol lactate (HALDOL) injection 5 mg, 5 mg, Intramuscular, Q6H PRN, Elesa Hacker, Thresa Ross, MD, 5 mg at 10/01/23 0334   LORazepam (ATIVAN) tablet 2 mg, 2 mg, Oral, Q6H PRN, 2 mg at 10/05/23 0934 **OR** LORazepam (ATIVAN) injection 2 mg, 2 mg, Intramuscular, Q6H PRN, Ezekiel Slocumb, MD, 2 mg at 10/03/23 1325   OLANZapine zydis (ZYPREXA) disintegrating tablet 10 mg, 10 mg, Oral, QHS, Denise Bridegroom, NP, 10 mg at 10/04/23 2102  Current Outpatient Medications:    acetaminophen (TYLENOL) 325 MG tablet, Take 2 tablets (650 mg total) by mouth every 6 (six) hours as needed., Disp: , Rfl:    cephALEXin (KEFLEX) 500 MG capsule, Take 1 capsule (500 mg total) by mouth every 6 (six) hours for 4 days., Disp: 16 capsule, Rfl: 0   cyclobenzaprine (FLEXERIL) 10 MG tablet, Take 1 tablet (10 mg total) by mouth every 8 (eight) hours as needed for muscle spasms., Disp: 30 tablet, Rfl: 0   ferrous gluconate (FERGON) 324 MG tablet, Take 1 tablet (324 mg total) by mouth every other day.  (Patient not taking: Reported on 01/10/2023), Disp: 30 tablet, Rfl: 0   ferrous sulfate 325 (65 FE) MG tablet, Take 1 tablet (325 mg total) by mouth every other day. (Patient not taking: Reported on 03/19/2023), Disp: 30 tablet, Rfl: 3   ibuprofen (ADVIL) 600 MG tablet, Take 1 tablet (600 mg total) by mouth every 6 (six) hours., Disp: 30 tablet, Rfl: 0   norethindrone (MICRONOR) 0.35 MG tablet, Take 1 tablet (0.35 mg total) by mouth daily. (Patient not taking: Reported on 10/02/2023), Disp: 28 tablet, Rfl: 11   OLANZapine zydis (ZYPREXA) 10 MG disintegrating tablet, Take 1 tablet (10 mg total) by mouth at bedtime., Disp: 30 tablet, Rfl: 0   ondansetron (ZOFRAN) 4 MG tablet, Take 1 tablet (4 mg total) by mouth every 6 (six) hours as needed for nausea., Disp: 20 tablet, Rfl: 0   traMADol (ULTRAM) 50 MG tablet, Take 1-2 tablets (50-100 mg total) by mouth every 6 (six) hours as needed for severe pain., Disp: 20 tablet, Rfl: 0  Allergies: Allergies  Allergen Reactions   Hydrocodone Itching    Denise Bridegroom, NP

## 2023-10-05 NOTE — ED Notes (Signed)
Pt took a bath and is back in bed at this time

## 2023-10-05 NOTE — Progress Notes (Signed)
Per Madelin Rear pt has been psych cleared. Provider has added resources to pt's AVS. This CSW will now remove pt from the BH/TTS shift report.  Care Team notified: Madelin Rear, Haze Justin, RN, Burnis Kingfisher, MSW, [facility] 10/05/2023 11:12 AM

## 2023-10-05 NOTE — Discharge Instructions (Addendum)
Our psychiatric team felt that you were cleared for incarceration.  Please take the antibiotics as prescribed and they did recommend that you continue with the Zyprexa 10 mg daily.  Return to the emergency department for worsening symptoms.   Outpatient psychiatric Services  Walk in hours for medication management Monday, Wednesday, Thursday, and Friday from 8:00 AM to 11:00 AM Recommend arriving by by 7:30 AM.  It is first come first serve.    Walk in hours for therapy intake Monday and Wednesday only 8:00 AM to 11:00 AM Encouraged to arrive by 7:30 AM.  It is first come first serve   Inpatient patient psychiatric services The [facility] offers comprehensive behavioral heath care services for mental health and substance abuse treatment.  Social work can also assist with referral to or getting you into a rehabilitation program short or long term  [facility] - One Page Referral Reference  [employer]   SUD interventions, primarily opioid use disorder and alcohol use disorder  MAT initiation and/or continuation if transitioning care  Can do enrollment and on-demand intake before discharge from ED/BHUC/FBC  Emphasis on virtual peer support community and case management  Providers have [place] licensure, DEA and Medicaid credentials  Qualified Referral HIPAA-compliant referral channel for providers  POC: Ricardo Jericho, [email or web address] [phone]  [facility]   Psychiatrist medication management for adults and adolescents (13+), mild/moderate acuity  MDD therapy and "Crisis Care" - a program exclusively for adults recovering from suicidal ideation  Can do enrollment and on-demand intake before discharge from ED/BHUC/FBC  Emphasis on 1:1 counselor access and mobile App tools/resources  Providers have [place] licensure, DEA and Medicaid credentials (except Tailored Plans), no BCBS of [place] yet  [facility] - Provider Portal  HIPAA-compliant referral channel  POC: [email or web address] & [email or web address] [phone]  [facility]   National mental health team supported by [facility]  Therapy and psychiatry with virtual clinics 7 days/week  Enrollment through 5-minute, direct booking call center  Emphasis on specialized therapists for children, teens, disordered eating, bipolar, trauma, and therapy in foreign  languages  Providers not in-network with [place] Medicaid yet, in progress  [facility] Portal HIPAA-compliant referral channel with a direct appointment booking site  POC: Trixie Deis, [email or web address] [phone]  [facility]   Local company with clinics based in [place] and [place]  Child/Adolescent/Young Adult (up to age 63) treatment plans  Virtual IOP through various structures, diagnosis and social considerations. Includes individual visits, CBT,  recreational therapy  Emphasis on support groups, flexibility and family engagement  Providers not in-network with [place] Medicaid yet, in progress  Professional Referrals [facility] HIPAA-compliant referral channel  POC: Kara Mead, [email or web address] [phone] Health Resources:  Intensive Outpatient Programs: [facility]      [address] [place], [place] [phone] Both a day and evening program       [facility] Outpatient     [address]        [place], [place] [address] [phone]         ADS: [facility] [address] [place] [phone]  [facility] Mental Health ACCESS LINE: [phone] or [phone] [address] [place] [address] [place], [place] [address] [email or web address]   Substance Abuse Resources: [facility] Drug [facility]  [phone] [facility] [phone] [facility] [place] [phone] Floydene Flock [phone] Residential &  Outpatient Substance Abuse Program  [phone]  Psychological Services: [facility]  [phone]  [employer] Services  [phone] [facility], [phone] [place]. [address] [place], ACCESS LINE: [phone] or [phone], [email or web address]  Mobile Crisis Teams:                                        [facility] Alternatives         Mobile Crisis Care Unit [phone]             [facility] [address] Ginette Otto [phone]                                         Interventionist [address] [place] [phone]  Self-Help/Support Groups: Mental Health Assoc. of [employer] of support groups [phone] (call for more info)  Narcotics Anonymous (NA) [facility] [address] [place] - 2 meetings at this location  Residential Treatment Programs:  [facility]      [address]        [place]       [phone]         [facility] [address] [place] [address] [place], [place]  [address] [phone]  [facility]  [address] [place], [place] [address] [phone] Admissions: 8am-3pm M-F  [facility]     [address]        [place], [place] [address]       [phone]         [facility] [address] [place], [place] [phone]  [facility] [address] [place], [place] [phone]  [facility] - Intensive Outpatient      [address]     [place], [place]       [phone]         [facility] (Addiction Recovery Care [facility]     [address] [place], [place] [phone] or [phone]  Residential Treatment Services (RTS), Medicaid [address] [place], [place] [phone]  [facility] [address]                                               [address] [place]  [place] [phone]  [facility] Resources: [phone]               General Therapy                                                Angie Fava, PhD        [address] [place] [address] [place], [place] [address]         [phone]   Insurance  [facility]   [address] [place], [place] [address] [phone]  [facility] [address] [place], [place]  [address] [phone] Insurance/Medicaid/sponsorship through [employer] and Families                                              [address]                                        [place], [place] [address]    Therapy/tele-psych/case         [phone]          [facility] [address], [place]  [address]  Adolescent/group home/case management [phone]                                           Creola Corn PhD       General therapy       Insurance   [phone]         Dr. Lolly Mustache, [place], M-F [phone]  [facility] [place]  [employer] [facility] Dept. [address]                 [address]         [address] [place] [address]  Blondell Reveal Phone:  [phone]                                  Phone:  [phone]                   Phone:  [phone]  [facility], [phone] [facility] - [facility]- [phone]       -     [facility] in [place], [address]             [phone], Insurance

## 2024-06-16 ENCOUNTER — Other Ambulatory Visit (HOSPITAL_COMMUNITY): Payer: Self-pay

## 2024-09-13 ENCOUNTER — Encounter: Payer: Self-pay | Admitting: Physician Assistant

## 2024-09-13 ENCOUNTER — Telehealth: Payer: MEDICAID

## 2024-09-13 DIAGNOSIS — Z719 Counseling, unspecified: Secondary | ICD-10-CM

## 2024-09-13 DIAGNOSIS — G43E09 Chronic migraine with aura, not intractable, without status migrainosus: Secondary | ICD-10-CM | POA: Diagnosis not present

## 2024-09-13 MED ORDER — RIZATRIPTAN BENZOATE 5 MG PO TABS: 5.0000 mg | ORAL_TABLET | ORAL | 0 refills | Status: AC | PRN

## 2024-09-13 NOTE — Progress Notes (Signed)
 " Virtual Visit Consent   Denise Clark, you are scheduled for a virtual visit with a [facility] provider today. Just as with appointments in the office, your consent must be obtained to participate. Your consent will be active for this visit and any virtual visit you may have with one of our providers in the next 365 days. If you have a MyChart account, a copy of this consent can be sent to you electronically.  As this is a virtual visit, video technology does not allow for your provider to perform a traditional examination. This may limit your provider's ability to fully assess your condition. If your provider identifies any concerns that need to be evaluated in person or the need to arrange testing (such as labs, EKG, etc.), we will make arrangements to do so. Although advances in technology are sophisticated, we cannot ensure that it will always work on either your end or our end. If the connection with a video visit is poor, the visit may have to be switched to a telephone visit. With either a video or telephone visit, we are not always able to ensure that we have a secure connection.  By engaging in this virtual visit, you consent to the provision of healthcare and authorize for your insurance to be billed (if applicable) for the services provided during this visit. Depending on your insurance coverage, you may receive a charge related to this service.  I need to obtain your verbal consent now. Are you willing to proceed with your visit today? Denise Clark has provided verbal consent on 09/13/2024 for a virtual visit (video or telephone). Jon CHRISTELLA Belt, NP  Date: 09/13/2024 9:24 AM   Virtual Visit via Video Note   I, Jon CHRISTELLA Belt, connected with  Denise Clark  ([id number], 03/06/1991) on 09/13/2024 at  9:15 AM EST by a video-enabled telemedicine application and verified that I am speaking with the correct person using two identifiers.  Location: Patient: Virtual Visit Location  Patient: Home Provider: Virtual Visit Location Provider: Home Office   I discussed the limitations of evaluation and management by telemedicine and the availability of in person appointments. The patient expressed understanding and agreed to proceed.    History of Present Illness: Denise Clark is a 34 y.o. who identifies as a female who was assigned female at birth, and is being seen today for advice on finding an ob/gyn [place]. Is not pregnant now but is interested in becoming pregnant - she has questions and concerns about this after having a tubal pregnancy in 2024. Has moved to [place], [place], no longer in [place] and needs help figuring out where she can go for GYN care in her new town  ALso c/o Migraines. Having every other day. Sees spots, lights bother her, has to rest in the dark. Sounds also bother her. No n/v. Takes ibuprofen  for this, asks for refill of ibuprofen  800mg . Has never taken triptan. Does have an aura, knows when migraines will start  HPI: HPI  Problems:  Patient Active Problem List   Diagnosis Date Noted   Severe bipolar I disorder, current or most recent episode manic, with psychotic features, with mixed features (HCC) 10/01/2023   Ruptured ectopic pregnancy 12/24/2022   UTI (urinary tract infection) 12/24/2022   Bipolar I disorder, most recent episode (or current) manic (HCC) 07/18/2019   Psychosis (HCC) 07/16/2019    Allergies: Allergies[1] Medications: Current Medications[2]  Observations/Objective: Patient is well-developed, well-nourished in no acute distress.  Resting comfortably  at home.  Head is normocephalic, atraumatic.  No labored breathing.  Speech is clear and coherent with logical content.  Patient is alert and oriented at baseline.    Assessment and Plan: 1. Chronic migraine with aura without status migrainosus, not intractable (Primary)  Needs to f/u with  pcp about migraines, will try maxalt  instead of ibuprofen   Follow Up  Instructions: I discussed the assessment and treatment plan with the patient. The patient was provided an opportunity to ask questions and all were answered. The patient agreed with the plan and demonstrated an understanding of the instructions.  A copy of instructions were sent to the patient via MyChart unless otherwise noted below.   The patient was advised to call back or seek an in-person evaluation if the symptoms worsen or if the condition fails to improve as anticipated.    Jon CHRISTELLA Belt, NP    [1]  Allergies Allergen Reactions   Hydrocodone  Itching  [2]  Current Outpatient Medications:    rizatriptan  (MAXALT ) 5 MG tablet, Take 1 tablet (5 mg total) by mouth as needed for migraine. May repeat in 2 hours if needed, Disp: 10 tablet, Rfl: 0   acetaminophen  (TYLENOL ) 325 MG tablet, Take 2 tablets (650 mg total) by mouth every 6 (six) hours as needed., Disp: , Rfl:    cyclobenzaprine  (FLEXERIL ) 10 MG tablet, Take 1 tablet (10 mg total) by mouth every 8 (eight) hours as needed for muscle spasms., Disp: 30 tablet, Rfl: 0   ferrous gluconate  (FERGON) 324 MG tablet, Take 1 tablet (324 mg total) by mouth every other day. (Patient not taking: Reported on 01/10/2023), Disp: 30 tablet, Rfl: 0   ferrous sulfate  325 (65 FE) MG tablet, Take 1 tablet (325 mg total) by mouth every other day. (Patient not taking: Reported on 03/19/2023), Disp: 30 tablet, Rfl: 3   ibuprofen  (ADVIL ) 600 MG tablet, Take 1 tablet (600 mg total) by mouth every 6 (six) hours., Disp: 30 tablet, Rfl: 0   norethindrone  (MICRONOR ) 0.35 MG tablet, Take 1 tablet (0.35 mg total) by mouth daily. (Patient not taking: Reported on 10/02/2023), Disp: 28 tablet, Rfl: 11   OLANZapine  zydis (ZYPREXA ) 10 MG disintegrating tablet, Take 1 tablet (10 mg total) by mouth at bedtime., Disp: 30 tablet, Rfl: 0   ondansetron  (ZOFRAN ) 4 MG tablet, Take 1 tablet (4 mg total) by mouth every 6 (six) hours as needed for nausea., Disp: 20 tablet, Rfl: 0    traMADol  (ULTRAM ) 50 MG tablet, Take 1-2 tablets (50-100 mg total) by mouth every 6 (six) hours as needed for severe pain., Disp: 20 tablet, Rfl: 0  "

## 2024-09-13 NOTE — Patient Instructions (Addendum)
 " Denise Clark, thank you for joining Jon CHRISTELLA Belt, NP for today's virtual visit.  While this provider is not your primary care provider (PCP), if your PCP is located in our provider database this encounter information will be shared with them immediately following your visit.   A [facility] MyChart account gives you access to today's visit and all your visits, tests, and labs performed at [facility] : [facility]  click here if you don't have a [facility] MyChart account or go to [email or web address]  Consent: (Patient) Denise Clark provided verbal consent for this virtual visit at the beginning of the encounter.  Current Medications:  Current Outpatient Medications:    rizatriptan  (MAXALT ) 5 MG tablet, Take 1 tablet (5 mg total) by mouth as needed for migraine. May repeat in 2 hours if needed, Disp: 10 tablet, Rfl: 0   acetaminophen  (TYLENOL ) 325 MG tablet, Take 2 tablets (650 mg total) by mouth every 6 (six) hours as needed., Disp: , Rfl:    cyclobenzaprine  (FLEXERIL ) 10 MG tablet, Take 1 tablet (10 mg total) by mouth every 8 (eight) hours as needed for muscle spasms., Disp: 30 tablet, Rfl: 0   ferrous gluconate  (FERGON) 324 MG tablet, Take 1 tablet (324 mg total) by mouth every other day. (Patient not taking: Reported on 01/10/2023), Disp: 30 tablet, Rfl: 0   ferrous sulfate  325 (65 FE) MG tablet, Take 1 tablet (325 mg total) by mouth every other day. (Patient not taking: Reported on 03/19/2023), Disp: 30 tablet, Rfl: 3   ibuprofen  (ADVIL ) 600 MG tablet, Take 1 tablet (600 mg total) by mouth every 6 (six) hours., Disp: 30 tablet, Rfl: 0   norethindrone  (MICRONOR ) 0.35 MG tablet, Take 1 tablet (0.35 mg total) by mouth daily. (Patient not taking: Reported on 10/02/2023), Disp: 28 tablet, Rfl: 11   OLANZapine  zydis (ZYPREXA ) 10 MG disintegrating tablet, Take 1 tablet (10 mg total) by mouth at bedtime., Disp: 30 tablet, Rfl: 0   ondansetron  (ZOFRAN ) 4 MG tablet, Take 1 tablet (4 mg  total) by mouth every 6 (six) hours as needed for nausea., Disp: 20 tablet, Rfl: 0   traMADol  (ULTRAM ) 50 MG tablet, Take 1-2 tablets (50-100 mg total) by mouth every 6 (six) hours as needed for severe pain., Disp: 20 tablet, Rfl: 0   Medications ordered in this encounter:  Meds ordered this encounter  Medications   rizatriptan  (MAXALT ) 5 MG tablet    Sig: Take 1 tablet (5 mg total) by mouth as needed for migraine. May repeat in 2 hours if needed    Dispense:  10 tablet    Refill:  0     *If you need refills on other medications prior to your next appointment, please contact your pharmacy*  Follow-Up: Call back or seek an in-person evaluation if the symptoms worsen or if the condition fails to improve as anticipated.  [facility] [phone]  Other Instructions  Please follow up with your primary care provider about your migraines. If you do not have a primary care provider, and you want to find one at [facility], go to [email or web address], click on primary care, click on new patients: schedule online, choose internal medicine or family medicine, answer a series of questions, and choose an appointment location and time that fits your schedule.   Try the migraine medicine instead of ibuprofen  for migraines.   Make an appointment at [facility] for [facility] at [facility] in [place]. [phone]  If you  have been instructed to have an in-person evaluation today at a local Urgent Care facility, please use the link below. It will take you to a list of all of our available [facility], including address, phone number and hours of operation. Please do not delay care.  [facility]  If you or a family member do not have a primary care provider, use the link below to schedule a visit and establish care. When you choose a [facility] primary care physician or advanced practice provider, you gain a long-term partner in health. Find a  Primary Care Provider  Learn more about [facility]'s in-office and virtual care options:  - Get Care Now  "

## 2024-11-04 ENCOUNTER — Encounter: Admitting: Obstetrics & Gynecology
